# Patient Record
Sex: Female | Born: 1960 | Race: White | Hispanic: No | Marital: Married | Smoking: Former smoker
Health system: Southern US, Community
[De-identification: ages and names within clinical notes are randomized; demographics above are authoritative.]

## PROBLEM LIST (undated history)

## (undated) DIAGNOSIS — I82409 Acute embolism and thrombosis of unspecified deep veins of unspecified lower extremity: Secondary | ICD-10-CM

## (undated) HISTORY — DX: Acute embolism and thrombosis of unspecified deep veins of unspecified lower extremity: I82.409

## (undated) HISTORY — PX: SPINE SURGERY: SHX786

---

## 1998-05-23 ENCOUNTER — Inpatient Hospital Stay (HOSPITAL_COMMUNITY): Admission: RE | Admit: 1998-05-23 | Discharge: 1998-05-24 | Payer: Self-pay | Admitting: Specialist

## 1998-12-10 ENCOUNTER — Ambulatory Visit (HOSPITAL_COMMUNITY): Admission: RE | Admit: 1998-12-10 | Discharge: 1998-12-10 | Payer: Self-pay

## 2000-04-30 ENCOUNTER — Emergency Department (HOSPITAL_COMMUNITY): Admission: EM | Admit: 2000-04-30 | Discharge: 2000-04-30 | Payer: Self-pay | Admitting: Emergency Medicine

## 2002-01-24 ENCOUNTER — Encounter: Payer: Self-pay | Admitting: Family Medicine

## 2002-01-24 ENCOUNTER — Inpatient Hospital Stay (HOSPITAL_COMMUNITY): Admission: EM | Admit: 2002-01-24 | Discharge: 2002-01-25 | Payer: Self-pay | Admitting: Family Medicine

## 2002-01-24 ENCOUNTER — Ambulatory Visit: Admission: RE | Admit: 2002-01-24 | Discharge: 2002-01-24 | Payer: Self-pay | Admitting: Family Medicine

## 2002-01-25 ENCOUNTER — Encounter: Payer: Self-pay | Admitting: Oncology

## 2002-02-20 ENCOUNTER — Encounter: Payer: Self-pay | Admitting: Family Medicine

## 2002-02-20 ENCOUNTER — Ambulatory Visit (HOSPITAL_COMMUNITY): Admission: RE | Admit: 2002-02-20 | Discharge: 2002-02-20 | Payer: Self-pay | Admitting: Family Medicine

## 2004-10-25 ENCOUNTER — Ambulatory Visit: Payer: Self-pay | Admitting: Oncology

## 2004-12-21 ENCOUNTER — Ambulatory Visit: Payer: Self-pay | Admitting: Oncology

## 2005-01-05 ENCOUNTER — Emergency Department (HOSPITAL_COMMUNITY): Admission: EM | Admit: 2005-01-05 | Discharge: 2005-01-05 | Payer: Self-pay | Admitting: Emergency Medicine

## 2005-02-07 ENCOUNTER — Ambulatory Visit: Payer: Self-pay | Admitting: Oncology

## 2005-04-04 ENCOUNTER — Ambulatory Visit: Payer: Self-pay | Admitting: Oncology

## 2005-05-30 ENCOUNTER — Ambulatory Visit: Payer: Self-pay | Admitting: Oncology

## 2005-08-16 ENCOUNTER — Ambulatory Visit: Payer: Self-pay | Admitting: Oncology

## 2005-10-14 ENCOUNTER — Ambulatory Visit: Payer: Self-pay | Admitting: Oncology

## 2006-01-19 ENCOUNTER — Ambulatory Visit: Payer: Self-pay | Admitting: Oncology

## 2006-04-19 ENCOUNTER — Ambulatory Visit: Payer: Self-pay | Admitting: Oncology

## 2006-04-21 LAB — CBC WITH DIFFERENTIAL/PLATELET
BASO%: 0.5 % (ref 0.0–2.0)
Basophils Absolute: 0 10*3/uL (ref 0.0–0.1)
EOS%: 2.7 % (ref 0.0–7.0)
Eosinophils Absolute: 0.1 10*3/uL (ref 0.0–0.5)
HCT: 36.3 % (ref 34.8–46.6)
HGB: 12.1 g/dL (ref 11.6–15.9)
LYMPH%: 29.5 % (ref 14.0–48.0)
MCH: 28.8 pg (ref 26.0–34.0)
MCHC: 33.4 g/dL (ref 32.0–36.0)
MCV: 86.5 fL (ref 81.0–101.0)
MONO#: 0.4 10*3/uL (ref 0.1–0.9)
MONO%: 8.3 % (ref 0.0–13.0)
NEUT#: 3.1 10*3/uL (ref 1.5–6.5)
NEUT%: 59 % (ref 39.6–76.8)
Platelets: 325 10*3/uL (ref 145–400)
RBC: 4.2 10*6/uL (ref 3.70–5.32)
RDW: 16.2 % — ABNORMAL HIGH (ref 11.3–14.5)
WBC: 5.3 10*3/uL (ref 3.9–10.0)
lymph#: 1.6 10*3/uL (ref 0.9–3.3)

## 2006-04-21 LAB — PROTHROMBIN TIME
INR: 1 (ref 0.0–1.5)
Prothrombin Time: 13.7 seconds (ref 11.6–15.2)

## 2006-04-26 LAB — LUPUS ANTICOAGULANT PANEL
DRVVT 1:1 Mix: 39.5 secs (ref 26.75–42.95)
DRVVT: 50.9 secs — ABNORMAL HIGH (ref 26.75–42.95)
PTT Lupus Anticoagulant: 59.2 secs — ABNORMAL HIGH (ref 30.5–43.1)
PTTLA 4:1 Mix: 54 secs — ABNORMAL HIGH (ref 30.5–43.1)
PTTLA Confirmation: 14.9 secs — ABNORMAL HIGH (ref <8.0)

## 2006-04-26 LAB — HEXAGONAL PHOSPHOLIPID NEUTRALIZATION: Hex Phosph Neut Test: POSITIVE — AB

## 2006-08-17 ENCOUNTER — Ambulatory Visit: Payer: Self-pay | Admitting: Oncology

## 2006-08-18 LAB — CBC WITH DIFFERENTIAL/PLATELET
BASO%: 0.9 % (ref 0.0–2.0)
Basophils Absolute: 0 10*3/uL (ref 0.0–0.1)
EOS%: 2.3 % (ref 0.0–7.0)
Eosinophils Absolute: 0.1 10*3/uL (ref 0.0–0.5)
HCT: 34.8 % (ref 34.8–46.6)
HGB: 11.8 g/dL (ref 11.6–15.9)
LYMPH%: 30.6 % (ref 14.0–48.0)
MCH: 29.5 pg (ref 26.0–34.0)
MCHC: 33.9 g/dL (ref 32.0–36.0)
MCV: 87.3 fL (ref 81.0–101.0)
MONO#: 0.4 10*3/uL (ref 0.1–0.9)
MONO%: 7.6 % (ref 0.0–13.0)
NEUT#: 3 10*3/uL (ref 1.5–6.5)
NEUT%: 58.6 % (ref 39.6–76.8)
Platelets: 340 10*3/uL (ref 145–400)
RBC: 3.99 10*6/uL (ref 3.70–5.32)
RDW: 13.5 % (ref 11.3–14.5)
WBC: 5.2 10*3/uL (ref 3.9–10.0)
lymph#: 1.6 10*3/uL (ref 0.9–3.3)

## 2006-08-23 LAB — D-DIMER, QUANTITATIVE (NOT AT ARMC): D-Dimer, Quant: 0.49 ug/mL-FEU — ABNORMAL HIGH (ref 0.00–0.48)

## 2006-08-23 LAB — CARDIOLIPIN ANTIBODIES, IGG, IGM, IGA
Anticardiolipin IgA: 17 [APL'U] (ref <13)
Anticardiolipin IgG: <7 [GPL'U] (ref <11)
Anticardiolipin IgM: <7 [MPL'U] (ref <10)

## 2006-08-23 LAB — HEXAGONAL PHOSPHOLIPID NEUTRALIZATION: Hex Phosph Neut Test: POSITIVE — AB

## 2006-08-25 LAB — LUPUS ANTICOAGULANT PANEL: Lupus Anticoagulant: DETECTED

## 2006-09-12 LAB — PROTIME-INR
INR: 1.8 — ABNORMAL LOW (ref 2.00–3.50)
Protime: 21.6 Seconds — ABNORMAL HIGH (ref 10.6–13.4)

## 2006-10-05 ENCOUNTER — Ambulatory Visit: Payer: Self-pay | Admitting: Oncology

## 2006-11-06 LAB — CBC WITH DIFFERENTIAL/PLATELET
BASO%: 1.5 % (ref 0.0–2.0)
Basophils Absolute: 0.1 10e3/uL (ref 0.0–0.1)
EOS%: 3.4 % (ref 0.0–7.0)
Eosinophils Absolute: 0.2 10e3/uL (ref 0.0–0.5)
HCT: 32.5 % — ABNORMAL LOW (ref 34.8–46.6)
HGB: 10.8 g/dL — ABNORMAL LOW (ref 11.6–15.9)
LYMPH%: 33.6 % (ref 14.0–48.0)
MCH: 27.6 pg (ref 26.0–34.0)
MCHC: 33.2 g/dL (ref 32.0–36.0)
MCV: 83.1 fL (ref 81.0–101.0)
MONO#: 0.5 10e3/uL (ref 0.1–0.9)
MONO%: 7 % (ref 0.0–13.0)
NEUT#: 3.6 10e3/uL (ref 1.5–6.5)
NEUT%: 54.5 % (ref 39.6–76.8)
Platelets: 368 10e3/uL (ref 145–400)
RBC: 3.91 10e6/uL (ref 3.70–5.32)
RDW: 12.4 % (ref 11.3–14.5)
WBC: 6.7 10e3/uL (ref 3.9–10.0)
lymph#: 2.2 10e3/uL (ref 0.9–3.3)

## 2006-11-06 LAB — PROTIME-INR
INR: 2.5 (ref 2.00–3.50)
Protime: 30 Seconds — ABNORMAL HIGH (ref 10.6–13.4)

## 2006-11-29 ENCOUNTER — Ambulatory Visit: Payer: Self-pay | Admitting: Oncology

## 2006-12-15 LAB — CBC WITH DIFFERENTIAL/PLATELET
BASO%: 1.3 % (ref 0.0–2.0)
Basophils Absolute: 0.1 10*3/uL (ref 0.0–0.1)
EOS%: 2.8 % (ref 0.0–7.0)
Eosinophils Absolute: 0.1 10*3/uL (ref 0.0–0.5)
HCT: 31.8 % — ABNORMAL LOW (ref 34.8–46.6)
HGB: 10.5 g/dL — ABNORMAL LOW (ref 11.6–15.9)
LYMPH%: 34.3 % (ref 14.0–48.0)
MCH: 26.7 pg (ref 26.0–34.0)
MCHC: 32.9 g/dL (ref 32.0–36.0)
MCV: 81.1 fL (ref 81.0–101.0)
MONO#: 0.3 10*3/uL (ref 0.1–0.9)
MONO%: 6 % (ref 0.0–13.0)
NEUT#: 2.8 10*3/uL (ref 1.5–6.5)
NEUT%: 55.6 % (ref 39.6–76.8)
Platelets: 346 10*3/uL (ref 145–400)
RBC: 3.92 10*6/uL (ref 3.70–5.32)
RDW: 12.7 % (ref 11.3–14.5)
WBC: 4.9 10*3/uL (ref 3.9–10.0)
lymph#: 1.7 10*3/uL (ref 0.9–3.3)

## 2006-12-15 LAB — PROTIME-INR
INR: 1.9 — ABNORMAL LOW (ref 2.00–3.50)
Protime: 22.8 Seconds — ABNORMAL HIGH (ref 10.6–13.4)

## 2006-12-19 LAB — LUPUS ANTICOAGULANT PANEL
DRVVT 1:1 Mix: 42.1 secs (ref 31.9–44.2)
DRVVT: 74.7 secs — ABNORMAL HIGH (ref 31.9–44.2)
PTT Lupus Anticoagulant: 74.6 secs — ABNORMAL HIGH (ref 36.3–48.8)
PTTLA 4:1 Mix: 62.5 secs — ABNORMAL HIGH (ref 36.3–48.8)
PTTLA Confirmation: 4.2 secs (ref <8.0)

## 2006-12-19 LAB — D-DIMER, QUANTITATIVE: D-Dimer, Quant: 0.43 ug/mL-FEU (ref 0.00–0.48)

## 2006-12-25 LAB — CARDIOLIPIN ANTIBODIES, IGG, IGM, IGA
Anticardiolipin IgG: <7 [GPL'U] (ref <11)
Anticardiolipin IgM: <7 [MPL'U] (ref <10)

## 2007-01-10 ENCOUNTER — Ambulatory Visit: Payer: Self-pay | Admitting: Oncology

## 2007-01-17 LAB — CBC WITH DIFFERENTIAL/PLATELET
BASO%: 1.2 % (ref 0.0–2.0)
Basophils Absolute: 0.1 10*3/uL (ref 0.0–0.1)
EOS%: 3 % (ref 0.0–7.0)
Eosinophils Absolute: 0.2 10*3/uL (ref 0.0–0.5)
HCT: 32.6 % — ABNORMAL LOW (ref 34.8–46.6)
HGB: 10.4 g/dL — ABNORMAL LOW (ref 11.6–15.9)
LYMPH%: 34.3 % (ref 14.0–48.0)
MCH: 26.5 pg (ref 26.0–34.0)
MCHC: 32 g/dL (ref 32.0–36.0)
MCV: 82.9 fL (ref 81.0–101.0)
MONO#: 0.4 10*3/uL (ref 0.1–0.9)
MONO%: 7.9 % (ref 0.0–13.0)
NEUT#: 2.9 10*3/uL (ref 1.5–6.5)
NEUT%: 53.6 % (ref 39.6–76.8)
Platelets: 361 10*3/uL (ref 145–400)
RBC: 3.94 10*6/uL (ref 3.70–5.32)
RDW: 12.7 % (ref 11.3–14.5)
WBC: 5.5 10*3/uL (ref 3.9–10.0)
lymph#: 1.9 10*3/uL (ref 0.9–3.3)

## 2007-01-17 LAB — PROTIME-INR
INR: 2 (ref 2.00–3.50)
Protime: 24 Seconds — ABNORMAL HIGH (ref 10.6–13.4)

## 2007-02-16 ENCOUNTER — Ambulatory Visit: Payer: Self-pay | Admitting: Oncology

## 2007-02-16 LAB — CBC WITH DIFFERENTIAL/PLATELET
BASO%: 1.3 % (ref 0.0–2.0)
Basophils Absolute: 0.1 10*3/uL (ref 0.0–0.1)
EOS%: 2.5 % (ref 0.0–7.0)
Eosinophils Absolute: 0.1 10*3/uL (ref 0.0–0.5)
HCT: 33.2 % — ABNORMAL LOW (ref 34.8–46.6)
HGB: 10.6 g/dL — ABNORMAL LOW (ref 11.6–15.9)
LYMPH%: 33.6 % (ref 14.0–48.0)
MCH: 25.7 pg — ABNORMAL LOW (ref 26.0–34.0)
MCHC: 32 g/dL (ref 32.0–36.0)
MCV: 80.4 fL — ABNORMAL LOW (ref 81.0–101.0)
MONO#: 0.5 10*3/uL (ref 0.1–0.9)
MONO%: 8.5 % (ref 0.0–13.0)
NEUT#: 3 10*3/uL (ref 1.5–6.5)
NEUT%: 54.1 % (ref 39.6–76.8)
Platelets: 377 10*3/uL (ref 145–400)
RBC: 4.13 10*6/uL (ref 3.70–5.32)
RDW: 12.8 % (ref 11.3–14.5)
WBC: 5.5 10*3/uL (ref 3.9–10.0)
lymph#: 1.8 10*3/uL (ref 0.9–3.3)

## 2007-02-16 LAB — PROTIME-INR
INR: 2.9 (ref 2.00–3.50)
Protime: 34.8 Seconds — ABNORMAL HIGH (ref 10.6–13.4)

## 2007-03-23 LAB — CBC WITH DIFFERENTIAL/PLATELET
BASO%: 1.2 % (ref 0.0–2.0)
Basophils Absolute: 0.1 10*3/uL (ref 0.0–0.1)
EOS%: 3.8 % (ref 0.0–7.0)
Eosinophils Absolute: 0.2 10*3/uL (ref 0.0–0.5)
HCT: 33.5 % — ABNORMAL LOW (ref 34.8–46.6)
HGB: 11.3 g/dL — ABNORMAL LOW (ref 11.6–15.9)
LYMPH%: 37.5 % (ref 14.0–48.0)
MCH: 27.7 pg (ref 26.0–34.0)
MCHC: 33.7 g/dL (ref 32.0–36.0)
MCV: 82.3 fL (ref 81.0–101.0)
MONO#: 0.4 10*3/uL (ref 0.1–0.9)
MONO%: 9 % (ref 0.0–13.0)
NEUT#: 2.2 10*3/uL (ref 1.5–6.5)
NEUT%: 48.5 % (ref 39.6–76.8)
Platelets: 316 10*3/uL (ref 145–400)
RBC: 4.07 10*6/uL (ref 3.70–5.32)
RDW: 14.3 % (ref 11.3–14.5)
WBC: 4.6 10*3/uL (ref 3.9–10.0)
lymph#: 1.7 10*3/uL (ref 0.9–3.3)

## 2007-03-23 LAB — PROTIME-INR
INR: 2.2 (ref 2.00–3.50)
Protime: 26.4 Seconds — ABNORMAL HIGH (ref 10.6–13.4)

## 2007-03-26 ENCOUNTER — Emergency Department (HOSPITAL_COMMUNITY): Admission: EM | Admit: 2007-03-26 | Discharge: 2007-03-26 | Payer: Self-pay | Admitting: Emergency Medicine

## 2007-04-10 ENCOUNTER — Ambulatory Visit: Payer: Self-pay | Admitting: Oncology

## 2007-04-13 LAB — CBC WITH DIFFERENTIAL/PLATELET
BASO%: 1 % (ref 0.0–2.0)
Basophils Absolute: 0 10*3/uL (ref 0.0–0.1)
EOS%: 2.5 % (ref 0.0–7.0)
Eosinophils Absolute: 0.1 10*3/uL (ref 0.0–0.5)
HCT: 31.8 % — ABNORMAL LOW (ref 34.8–46.6)
HGB: 10.9 g/dL — ABNORMAL LOW (ref 11.6–15.9)
LYMPH%: 34.1 % (ref 14.0–48.0)
MCH: 27.4 pg (ref 26.0–34.0)
MCHC: 34.2 g/dL (ref 32.0–36.0)
MCV: 80.2 fL — ABNORMAL LOW (ref 81.0–101.0)
MONO#: 0.3 10*3/uL (ref 0.1–0.9)
MONO%: 8.5 % (ref 0.0–13.0)
NEUT#: 2.2 10*3/uL (ref 1.5–6.5)
NEUT%: 53.9 % (ref 39.6–76.8)
Platelets: 360 10*3/uL (ref 145–400)
RBC: 3.97 10*6/uL (ref 3.70–5.32)
RDW: 16.8 % — ABNORMAL HIGH (ref 11.3–14.5)
WBC: 4.1 10*3/uL (ref 3.9–10.0)
lymph#: 1.4 10*3/uL (ref 0.9–3.3)

## 2007-04-13 LAB — PROTIME-INR
INR: 2.1 (ref 2.00–3.50)
Protime: 25.2 Seconds — ABNORMAL HIGH (ref 10.6–13.4)

## 2007-04-16 LAB — D-DIMER, QUANTITATIVE: D-Dimer, Quant: 0.26 ug/mL-FEU (ref 0.00–0.48)

## 2007-04-16 LAB — LUPUS ANTICOAGULANT PANEL
DRVVT 1:1 Mix: 43.5 secs (ref 36.1–47.0)
DRVVT: 65.3 secs — ABNORMAL HIGH (ref 36.1–47.0)
PTT Lupus Anticoagulant: 73 secs — ABNORMAL HIGH (ref 36.3–48.8)
PTTLA 4:1 Mix: 64.4 secs — ABNORMAL HIGH (ref 36.3–48.8)
PTTLA Confirmation: 0 secs (ref <8.0)

## 2007-06-04 ENCOUNTER — Ambulatory Visit: Payer: Self-pay | Admitting: Oncology

## 2007-06-06 LAB — CBC WITH DIFFERENTIAL/PLATELET
BASO%: 0.9 % (ref 0.0–2.0)
Basophils Absolute: 0.1 10*3/uL (ref 0.0–0.1)
EOS%: 4.2 % (ref 0.0–7.0)
Eosinophils Absolute: 0.2 10*3/uL (ref 0.0–0.5)
HCT: 29 % — ABNORMAL LOW (ref 34.8–46.6)
HGB: 9.8 g/dL — ABNORMAL LOW (ref 11.6–15.9)
LYMPH%: 32.6 % (ref 14.0–48.0)
MCH: 27.6 pg (ref 26.0–34.0)
MCHC: 33.8 g/dL (ref 32.0–36.0)
MCV: 81.7 fL (ref 81.0–101.0)
MONO#: 0.5 10*3/uL (ref 0.1–0.9)
MONO%: 7.6 % (ref 0.0–13.0)
NEUT#: 3.3 10*3/uL (ref 1.5–6.5)
NEUT%: 54.8 % (ref 39.6–76.8)
Platelets: 323 10*3/uL (ref 145–400)
RBC: 3.55 10*6/uL — ABNORMAL LOW (ref 3.70–5.32)
RDW: 13.2 % (ref 11.3–14.5)
WBC: 6 10*3/uL (ref 3.9–10.0)
lymph#: 1.9 10*3/uL (ref 0.9–3.3)

## 2007-06-06 LAB — PROTIME-INR
INR: 1.5 — ABNORMAL LOW (ref 2.00–3.50)
Protime: 18 Seconds — ABNORMAL HIGH (ref 10.6–13.4)

## 2007-06-20 LAB — PROTIME-INR
INR: 2.4 (ref 2.00–3.50)
Protime: 28.8 Seconds — ABNORMAL HIGH (ref 10.6–13.4)

## 2007-07-06 LAB — PROTIME-INR
INR: 2 (ref 2.00–3.50)
Protime: 24 Seconds — ABNORMAL HIGH (ref 10.6–13.4)

## 2007-08-17 ENCOUNTER — Ambulatory Visit: Payer: Self-pay | Admitting: Oncology

## 2007-08-17 LAB — PROTIME-INR
INR: 2.1 (ref 2.00–3.50)
Protime: 25.2 Seconds — ABNORMAL HIGH (ref 10.6–13.4)

## 2007-09-04 LAB — CBC WITH DIFFERENTIAL/PLATELET
BASO%: 0.7 % (ref 0.0–2.0)
Basophils Absolute: 0 10*3/uL (ref 0.0–0.1)
EOS%: 2.7 % (ref 0.0–7.0)
Eosinophils Absolute: 0.2 10*3/uL (ref 0.0–0.5)
HCT: 28.3 % — ABNORMAL LOW (ref 34.8–46.6)
HGB: 9.6 g/dL — ABNORMAL LOW (ref 11.6–15.9)
LYMPH%: 31.6 % (ref 14.0–48.0)
MCH: 26.1 pg (ref 26.0–34.0)
MCHC: 34 g/dL (ref 32.0–36.0)
MCV: 76.8 fL — ABNORMAL LOW (ref 81.0–101.0)
MONO#: 0.4 10*3/uL (ref 0.1–0.9)
MONO%: 7.6 % (ref 0.0–13.0)
NEUT#: 3.3 10*3/uL (ref 1.5–6.5)
NEUT%: 57.4 % (ref 39.6–76.8)
Platelets: 383 10*3/uL (ref 145–400)
RBC: 3.68 10*6/uL — ABNORMAL LOW (ref 3.70–5.32)
RDW: 15.6 % — ABNORMAL HIGH (ref 11.3–14.5)
WBC: 5.8 10*3/uL (ref 3.9–10.0)
lymph#: 1.8 10*3/uL (ref 0.9–3.3)

## 2007-09-04 LAB — PROTIME-INR
INR: 2.3 (ref 2.00–3.50)
Protime: 27.6 Seconds — ABNORMAL HIGH (ref 10.6–13.4)

## 2007-09-06 LAB — CARDIOLIPIN ANTIBODIES, IGG, IGM, IGA
Anticardiolipin IgA: 8 [APL'U] (ref <13)
Anticardiolipin IgG: <7 [GPL'U] (ref <11)
Anticardiolipin IgM: <7 [MPL'U] (ref <10)

## 2007-09-06 LAB — LUPUS ANTICOAGULANT PANEL
DRVVT 1:1 Mix: 42.8 secs (ref 36.1–47.0)
DRVVT: 79.2 secs — ABNORMAL HIGH (ref 36.1–47.0)
PTT Lupus Anticoagulant: 76.5 secs — ABNORMAL HIGH (ref 36.3–48.8)
PTTLA 4:1 Mix: 65.1 secs — ABNORMAL HIGH (ref 36.3–48.8)
PTTLA Confirmation: 1.9 secs (ref <8.0)

## 2007-10-02 ENCOUNTER — Ambulatory Visit: Payer: Self-pay | Admitting: Oncology

## 2007-10-04 LAB — CBC WITH DIFFERENTIAL/PLATELET
BASO%: 0.9 % (ref 0.0–2.0)
Basophils Absolute: 0 10*3/uL (ref 0.0–0.1)
EOS%: 1.9 % (ref 0.0–7.0)
Eosinophils Absolute: 0.1 10*3/uL (ref 0.0–0.5)
HCT: 28.8 % — ABNORMAL LOW (ref 34.8–46.6)
HGB: 9.1 g/dL — ABNORMAL LOW (ref 11.6–15.9)
LYMPH%: 36.6 % (ref 14.0–48.0)
MCH: 24.8 pg — ABNORMAL LOW (ref 26.0–34.0)
MCHC: 31.5 g/dL — ABNORMAL LOW (ref 32.0–36.0)
MCV: 78.7 fL — ABNORMAL LOW (ref 81.0–101.0)
MONO#: 0.5 10*3/uL (ref 0.1–0.9)
MONO%: 8.2 % (ref 0.0–13.0)
NEUT#: 2.9 10*3/uL (ref 1.5–6.5)
NEUT%: 52.5 % (ref 39.6–76.8)
Platelets: 356 10*3/uL (ref 145–400)
RBC: 3.66 10*6/uL — ABNORMAL LOW (ref 3.70–5.32)
RDW: 13.8 % (ref 11.3–14.5)
WBC: 5.6 10*3/uL (ref 3.9–10.0)
lymph#: 2 10*3/uL (ref 0.9–3.3)

## 2007-10-04 LAB — PROTIME-INR
INR: 2.1 (ref 2.00–3.50)
Protime: 25.2 Seconds — ABNORMAL HIGH (ref 10.6–13.4)

## 2007-11-06 LAB — CBC WITH DIFFERENTIAL/PLATELET
BASO%: 1.1 % (ref 0.0–2.0)
Basophils Absolute: 0.1 10*3/uL (ref 0.0–0.1)
EOS%: 1.9 % (ref 0.0–7.0)
Eosinophils Absolute: 0.1 10*3/uL (ref 0.0–0.5)
HCT: 33.1 % — ABNORMAL LOW (ref 34.8–46.6)
HGB: 10.9 g/dL — ABNORMAL LOW (ref 11.6–15.9)
LYMPH%: 35.8 % (ref 14.0–48.0)
MCH: 26.4 pg (ref 26.0–34.0)
MCHC: 32.9 g/dL (ref 32.0–36.0)
MCV: 80.1 fL — ABNORMAL LOW (ref 81.0–101.0)
MONO#: 0.5 10*3/uL (ref 0.1–0.9)
MONO%: 8.2 % (ref 0.0–13.0)
NEUT#: 3.4 10*3/uL (ref 1.5–6.5)
NEUT%: 53 % (ref 39.6–76.8)
Platelets: 349 10*3/uL (ref 145–400)
RBC: 4.13 10*6/uL (ref 3.70–5.32)
RDW: 15.1 % — ABNORMAL HIGH (ref 11.3–14.5)
WBC: 6.3 10*3/uL (ref 3.9–10.0)
lymph#: 2.3 10*3/uL (ref 0.9–3.3)

## 2007-11-06 LAB — PROTIME-INR
INR: 2 (ref 2.00–3.50)
Protime: 24 Seconds — ABNORMAL HIGH (ref 10.6–13.4)

## 2007-11-30 ENCOUNTER — Ambulatory Visit: Payer: Self-pay | Admitting: Oncology

## 2007-12-10 LAB — CBC WITH DIFFERENTIAL/PLATELET
BASO%: 0.9 % (ref 0.0–2.0)
Basophils Absolute: 0 10*3/uL (ref 0.0–0.1)
EOS%: 5.6 % (ref 0.0–7.0)
Eosinophils Absolute: 0.2 10*3/uL (ref 0.0–0.5)
HCT: 31.9 % — ABNORMAL LOW (ref 34.8–46.6)
HGB: 10.5 g/dL — ABNORMAL LOW (ref 11.6–15.9)
LYMPH%: 38.3 % (ref 14.0–48.0)
MCH: 27 pg (ref 26.0–34.0)
MCHC: 32.8 g/dL (ref 32.0–36.0)
MCV: 82.4 fL (ref 81.0–101.0)
MONO#: 0.3 10*3/uL (ref 0.1–0.9)
MONO%: 5.7 % (ref 0.0–13.0)
NEUT#: 2.2 10*3/uL (ref 1.5–6.5)
NEUT%: 49.4 % (ref 39.6–76.8)
Platelets: 188 10*3/uL (ref 145–400)
RBC: 3.87 10*6/uL (ref 3.70–5.32)
RDW: 13.8 % (ref 11.3–14.5)
WBC: 4.4 10*3/uL (ref 3.9–10.0)
lymph#: 1.7 10*3/uL (ref 0.9–3.3)

## 2007-12-10 LAB — PROTIME-INR
INR: 2.5 (ref 2.00–3.50)
Protime: 30 Seconds — ABNORMAL HIGH (ref 10.6–13.4)

## 2008-01-24 ENCOUNTER — Ambulatory Visit: Payer: Self-pay | Admitting: Oncology

## 2008-01-28 LAB — CBC WITH DIFFERENTIAL/PLATELET
BASO%: 0.3 % (ref 0.0–2.0)
Basophils Absolute: 0 10*3/uL (ref 0.0–0.1)
EOS%: 2.4 % (ref 0.0–7.0)
Eosinophils Absolute: 0.2 10*3/uL (ref 0.0–0.5)
HCT: 28.2 % — ABNORMAL LOW (ref 34.8–46.6)
HGB: 9.7 g/dL — ABNORMAL LOW (ref 11.6–15.9)
LYMPH%: 34.8 % (ref 14.0–48.0)
MCH: 27.2 pg (ref 26.0–34.0)
MCHC: 34.4 g/dL (ref 32.0–36.0)
MCV: 79.2 fL — ABNORMAL LOW (ref 81.0–101.0)
MONO#: 0.4 10*3/uL (ref 0.1–0.9)
MONO%: 5.8 % (ref 0.0–13.0)
NEUT#: 4.1 10*3/uL (ref 1.5–6.5)
NEUT%: 56.7 % (ref 39.6–76.8)
Platelets: 333 10*3/uL (ref 145–400)
RBC: 3.57 10*6/uL — ABNORMAL LOW (ref 3.70–5.32)
RDW: 16.4 % — ABNORMAL HIGH (ref 11.3–14.5)
WBC: 7.2 10*3/uL (ref 3.9–10.0)
lymph#: 2.5 10*3/uL (ref 0.9–3.3)

## 2008-01-28 LAB — PROTIME-INR
INR: 1.7 — ABNORMAL LOW (ref 2.00–3.50)
Protime: 20.4 Seconds — ABNORMAL HIGH (ref 10.6–13.4)

## 2008-02-29 LAB — CBC WITH DIFFERENTIAL/PLATELET
BASO%: 0.2 % (ref 0.0–2.0)
Basophils Absolute: 0 10*3/uL (ref 0.0–0.1)
EOS%: 1.8 % (ref 0.0–7.0)
Eosinophils Absolute: 0.1 10*3/uL (ref 0.0–0.5)
HCT: 29.9 % — ABNORMAL LOW (ref 34.8–46.6)
HGB: 10.2 g/dL — ABNORMAL LOW (ref 11.6–15.9)
LYMPH%: 31.4 % (ref 14.0–48.0)
MCH: 26.9 pg (ref 26.0–34.0)
MCHC: 34 g/dL (ref 32.0–36.0)
MCV: 79.3 fL — ABNORMAL LOW (ref 81.0–101.0)
MONO#: 0.3 10*3/uL (ref 0.1–0.9)
MONO%: 7 % (ref 0.0–13.0)
NEUT#: 2.5 10*3/uL (ref 1.5–6.5)
NEUT%: 59.6 % (ref 39.6–76.8)
Platelets: 360 10*3/uL (ref 145–400)
RBC: 3.77 10*6/uL (ref 3.70–5.32)
RDW: 17.7 % — ABNORMAL HIGH (ref 11.3–14.5)
WBC: 4.2 10*3/uL (ref 3.9–10.0)
lymph#: 1.3 10*3/uL (ref 0.9–3.3)

## 2008-02-29 LAB — PROTIME-INR
INR: 1.5 — ABNORMAL LOW (ref 2.00–3.50)
Protime: 18 Seconds — ABNORMAL HIGH (ref 10.6–13.4)

## 2008-03-06 LAB — BETA-2 GLYCOPROTEIN ANTIBODIES
Beta-2 Glyco I IgG: 7 U/mL (ref <20)
Beta-2-Glycoprotein I IgA: <4 U/mL (ref <10)
Beta-2-Glycoprotein I IgM: 5 U/mL (ref <10)

## 2008-03-06 LAB — CARDIOLIPIN ANTIBODIES, IGG, IGM, IGA
Anticardiolipin IgA: 14 [APL'U] (ref <13)
Anticardiolipin IgG: <7 [GPL'U] (ref <11)
Anticardiolipin IgM: <7 [MPL'U] (ref <10)

## 2008-03-06 LAB — LUPUS ANTICOAGULANT PANEL
DRVVT 1:1 Mix: 40.5 secs (ref 36.1–47.0)
DRVVT: 58.1 secs — ABNORMAL HIGH (ref 36.1–47.0)
PTT Lupus Anticoagulant: 65.1 secs — ABNORMAL HIGH (ref 36.3–48.8)
PTTLA 4:1 Mix: 55.7 secs — ABNORMAL HIGH (ref 36.3–48.8)
PTTLA Confirmation: 4.6 secs (ref <8.0)

## 2008-03-14 ENCOUNTER — Ambulatory Visit: Payer: Self-pay | Admitting: Oncology

## 2008-03-18 LAB — PROTIME-INR
INR: 2.1 (ref 2.00–3.50)
Protime: 25.2 Seconds — ABNORMAL HIGH (ref 10.6–13.4)

## 2008-05-23 ENCOUNTER — Ambulatory Visit: Payer: Self-pay | Admitting: Oncology

## 2008-05-27 LAB — PROTHROMBIN TIME
INR: 2.4 — ABNORMAL HIGH (ref 0.0–1.5)
Prothrombin Time: 27 seconds — ABNORMAL HIGH (ref 11.6–15.2)

## 2008-05-27 LAB — CBC WITH DIFFERENTIAL/PLATELET
BASO%: 0.6 % (ref 0.0–2.0)
Basophils Absolute: 0 10*3/uL (ref 0.0–0.1)
EOS%: 1.6 % (ref 0.0–7.0)
Eosinophils Absolute: 0.1 10*3/uL (ref 0.0–0.5)
HCT: 30.6 % — ABNORMAL LOW (ref 34.8–46.6)
HGB: 10.3 g/dL — ABNORMAL LOW (ref 11.6–15.9)
LYMPH%: 31.1 % (ref 14.0–48.0)
MCH: 27.6 pg (ref 26.0–34.0)
MCHC: 33.6 g/dL (ref 32.0–36.0)
MCV: 82.2 fL (ref 81.0–101.0)
MONO#: 0.3 10*3/uL (ref 0.1–0.9)
MONO%: 6.3 % (ref 0.0–13.0)
NEUT#: 2.8 10*3/uL (ref 1.5–6.5)
NEUT%: 60.4 % (ref 39.6–76.8)
Platelets: 295 10*3/uL (ref 145–400)
RBC: 3.72 10*6/uL (ref 3.70–5.32)
RDW: 21.5 % — ABNORMAL HIGH (ref 11.3–14.5)
WBC: 4.6 10*3/uL (ref 3.9–10.0)
lymph#: 1.4 10*3/uL (ref 0.9–3.3)

## 2008-05-29 LAB — CARDIOLIPIN ANTIBODIES, IGG, IGM, IGA
Anticardiolipin IgA: 10 [APL'U] (ref <13)
Anticardiolipin IgG: <7 [GPL'U] (ref <11)
Anticardiolipin IgM: <7 [MPL'U] (ref <10)

## 2008-05-29 LAB — LUPUS ANTICOAGULANT PANEL
DRVVT 1:1 Mix: 41 secs (ref 36.1–47.0)
DRVVT: 65.4 secs — ABNORMAL HIGH (ref 36.1–47.0)
Lupus Anticoagulant: DETECTED
PTT Lupus Anticoagulant: 61.2 secs — ABNORMAL HIGH (ref 36.3–48.8)
PTTLA 4:1 Mix: 53.1 secs — ABNORMAL HIGH (ref 36.3–48.8)
PTTLA Confirmation: 6.3 secs (ref <8.0)

## 2008-06-02 LAB — HEXAGONAL PHOSPHOLIPID NEUTRALIZATION: Hex Phosph Neut Test: POSITIVE — AB

## 2008-06-26 ENCOUNTER — Ambulatory Visit: Payer: Self-pay | Admitting: Oncology

## 2008-06-26 LAB — PROTIME-INR
INR: 2 (ref 2.00–3.50)
Protime: 24 Seconds — ABNORMAL HIGH (ref 10.6–13.4)

## 2008-07-24 ENCOUNTER — Ambulatory Visit (HOSPITAL_COMMUNITY): Admission: RE | Admit: 2008-07-24 | Discharge: 2008-07-24 | Payer: Self-pay | Admitting: Oncology

## 2008-07-24 ENCOUNTER — Ambulatory Visit: Payer: Self-pay | Admitting: Vascular Surgery

## 2008-07-24 ENCOUNTER — Encounter: Payer: Self-pay | Admitting: Oncology

## 2008-07-24 LAB — CBC WITH DIFFERENTIAL/PLATELET
BASO%: 0 % (ref 0.0–2.0)
Basophils Absolute: 0 10*3/uL (ref 0.0–0.1)
EOS%: 2.7 % (ref 0.0–7.0)
Eosinophils Absolute: 0.1 10*3/uL (ref 0.0–0.5)
HCT: 28.6 % — ABNORMAL LOW (ref 34.8–46.6)
HGB: 9.5 g/dL — ABNORMAL LOW (ref 11.6–15.9)
LYMPH%: 29 % (ref 14.0–48.0)
MCH: 26.8 pg (ref 26.0–34.0)
MCHC: 33.2 g/dL (ref 32.0–36.0)
MCV: 80.5 fL — ABNORMAL LOW (ref 81.0–101.0)
MONO#: 0.5 10*3/uL (ref 0.1–0.9)
MONO%: 9.9 % (ref 0.0–13.0)
NEUT#: 2.9 10*3/uL (ref 1.5–6.5)
NEUT%: 58.4 % (ref 39.6–76.8)
Platelets: 293 10*3/uL (ref 145–400)
RBC: 3.55 10*6/uL — ABNORMAL LOW (ref 3.70–5.32)
RDW: 16.5 % — ABNORMAL HIGH (ref 11.3–14.5)
WBC: 4.9 10*3/uL (ref 3.9–10.0)
lymph#: 1.4 10*3/uL (ref 0.9–3.3)

## 2008-07-24 LAB — PROTIME-INR
INR: 2.2 (ref 2.00–3.50)
Protime: 26.4 Seconds — ABNORMAL HIGH (ref 10.6–13.4)

## 2008-08-15 ENCOUNTER — Ambulatory Visit: Payer: Self-pay | Admitting: Oncology

## 2008-10-31 ENCOUNTER — Ambulatory Visit: Payer: Self-pay | Admitting: Oncology

## 2008-10-31 LAB — PROTIME-INR
INR: 1.9 — ABNORMAL LOW (ref 2.00–3.50)
Protime: 22.8 Seconds — ABNORMAL HIGH (ref 10.6–13.4)

## 2008-12-01 LAB — PROTIME-INR
INR: 2.4 (ref 2.00–3.50)
Protime: 28.8 Seconds — ABNORMAL HIGH (ref 10.6–13.4)

## 2008-12-25 ENCOUNTER — Ambulatory Visit: Payer: Self-pay | Admitting: Oncology

## 2009-01-27 LAB — CBC WITH DIFFERENTIAL/PLATELET
BASO%: 0.2 % (ref 0.0–2.0)
Basophils Absolute: 0 10*3/uL (ref 0.0–0.1)
EOS%: 4.2 % (ref 0.0–7.0)
Eosinophils Absolute: 0.2 10*3/uL (ref 0.0–0.5)
HCT: 30.9 % — ABNORMAL LOW (ref 34.8–46.6)
HGB: 9.9 g/dL — ABNORMAL LOW (ref 11.6–15.9)
LYMPH%: 37.4 % (ref 14.0–48.0)
MCH: 24.4 pg — ABNORMAL LOW (ref 26.0–34.0)
MCHC: 32.1 g/dL (ref 32.0–36.0)
MCV: 75.8 fL — ABNORMAL LOW (ref 81.0–101.0)
MONO#: 0.4 10*3/uL (ref 0.1–0.9)
MONO%: 8.9 % (ref 0.0–13.0)
NEUT#: 2.2 10*3/uL (ref 1.5–6.5)
NEUT%: 49.3 % (ref 39.6–76.8)
Platelets: 425 10*3/uL — ABNORMAL HIGH (ref 145–400)
RBC: 4.07 10*6/uL (ref 3.70–5.32)
RDW: 27.6 % — ABNORMAL HIGH (ref 11.3–14.5)
WBC: 4.5 10*3/uL (ref 3.9–10.0)
lymph#: 1.7 10*3/uL (ref 0.9–3.3)

## 2009-01-27 LAB — FERRITIN: Ferritin: 14 ng/mL (ref 10–291)

## 2009-01-27 LAB — PROTIME-INR
INR: 2.1 (ref 2.00–3.50)
Protime: 25.2 Seconds — ABNORMAL HIGH (ref 10.6–13.4)

## 2009-02-20 ENCOUNTER — Ambulatory Visit: Payer: Self-pay | Admitting: Oncology

## 2009-02-26 LAB — PROTIME-INR
INR: 2.8 (ref 2.00–3.50)
Protime: 33.6 Seconds — ABNORMAL HIGH (ref 10.6–13.4)

## 2009-04-23 ENCOUNTER — Ambulatory Visit: Payer: Self-pay | Admitting: Oncology

## 2009-04-27 LAB — CBC WITH DIFFERENTIAL/PLATELET
BASO%: 0.6 % (ref 0.0–2.0)
Basophils Absolute: 0 10*3/uL (ref 0.0–0.1)
EOS%: 1.9 % (ref 0.0–7.0)
Eosinophils Absolute: 0.1 10*3/uL (ref 0.0–0.5)
HCT: 29.8 % — ABNORMAL LOW (ref 34.8–46.6)
HGB: 9.5 g/dL — ABNORMAL LOW (ref 11.6–15.9)
LYMPH%: 22.7 % (ref 14.0–49.7)
MCH: 25 pg — ABNORMAL LOW (ref 25.1–34.0)
MCHC: 31.9 g/dL (ref 31.5–36.0)
MCV: 78.4 fL — ABNORMAL LOW (ref 79.5–101.0)
MONO#: 0.5 10*3/uL (ref 0.1–0.9)
MONO%: 7.1 % (ref 0.0–14.0)
NEUT#: 4.9 10*3/uL (ref 1.5–6.5)
NEUT%: 67.7 % (ref 38.4–76.8)
Platelets: 360 10*3/uL (ref 145–400)
RBC: 3.8 10*6/uL (ref 3.70–5.45)
RDW: 14.9 % — ABNORMAL HIGH (ref 11.2–14.5)
WBC: 7.2 10*3/uL (ref 3.9–10.3)
lymph#: 1.6 10*3/uL (ref 0.9–3.3)

## 2009-04-27 LAB — PROTIME-INR
INR: 1.7 — ABNORMAL LOW (ref 2.00–3.50)
Protime: 20.4 Seconds — ABNORMAL HIGH (ref 10.6–13.4)

## 2009-05-29 LAB — PROTIME-INR
INR: 2.5 (ref 2.00–3.50)
Protime: 30 Seconds — ABNORMAL HIGH (ref 10.6–13.4)

## 2009-05-29 LAB — CBC WITH DIFFERENTIAL/PLATELET
BASO%: 0.8 % (ref 0.0–2.0)
Basophils Absolute: 0 10*3/uL (ref 0.0–0.1)
EOS%: 3.8 % (ref 0.0–7.0)
Eosinophils Absolute: 0.2 10*3/uL (ref 0.0–0.5)
HCT: 32.8 % — ABNORMAL LOW (ref 34.8–46.6)
HGB: 10.4 g/dL — ABNORMAL LOW (ref 11.6–15.9)
LYMPH%: 31.6 % (ref 14.0–49.7)
MCH: 26.4 pg (ref 25.1–34.0)
MCHC: 31.7 g/dL (ref 31.5–36.0)
MCV: 83.2 fL (ref 79.5–101.0)
MONO#: 0.5 10*3/uL (ref 0.1–0.9)
MONO%: 9 % (ref 0.0–14.0)
NEUT#: 2.9 10*3/uL (ref 1.5–6.5)
NEUT%: 54.8 % (ref 38.4–76.8)
Platelets: 288 10*3/uL (ref 145–400)
RBC: 3.94 10*6/uL (ref 3.70–5.45)
RDW: 18.4 % — ABNORMAL HIGH (ref 11.2–14.5)
WBC: 5.3 10*3/uL (ref 3.9–10.3)
lymph#: 1.7 10*3/uL (ref 0.9–3.3)

## 2009-06-18 ENCOUNTER — Ambulatory Visit: Payer: Self-pay | Admitting: Oncology

## 2009-07-21 ENCOUNTER — Ambulatory Visit: Payer: Self-pay | Admitting: Oncology

## 2009-07-21 LAB — PROTIME-INR
INR: 3.4 (ref 2.00–3.50)
Protime: 40.8 Seconds — ABNORMAL HIGH (ref 10.6–13.4)

## 2009-08-02 ENCOUNTER — Emergency Department (HOSPITAL_COMMUNITY): Admission: EM | Admit: 2009-08-02 | Discharge: 2009-08-02 | Payer: Self-pay | Admitting: Emergency Medicine

## 2009-08-02 ENCOUNTER — Encounter (INDEPENDENT_AMBULATORY_CARE_PROVIDER_SITE_OTHER): Payer: Self-pay | Admitting: Emergency Medicine

## 2009-08-02 ENCOUNTER — Ambulatory Visit: Payer: Self-pay | Admitting: Vascular Surgery

## 2009-09-01 ENCOUNTER — Ambulatory Visit: Payer: Self-pay | Admitting: Oncology

## 2009-09-04 LAB — PROTIME-INR
INR: 3.3 (ref 2.00–3.50)
Protime: 39.6 Seconds — ABNORMAL HIGH (ref 10.6–13.4)

## 2009-09-10 LAB — PROTIME-INR
INR: 1.8 — ABNORMAL LOW (ref 2.00–3.50)
Protime: 21.6 Seconds — ABNORMAL HIGH (ref 10.6–13.4)

## 2009-10-01 ENCOUNTER — Ambulatory Visit: Payer: Self-pay | Admitting: Oncology

## 2009-10-05 LAB — PROTIME-INR
INR: 2.1 (ref 2.00–3.50)
Protime: 25.2 Seconds — ABNORMAL HIGH (ref 10.6–13.4)

## 2009-10-27 LAB — CBC WITH DIFFERENTIAL/PLATELET
BASO%: 0.7 % (ref 0.0–2.0)
Basophils Absolute: 0 10*3/uL (ref 0.0–0.1)
EOS%: 2.9 % (ref 0.0–7.0)
Eosinophils Absolute: 0.1 10*3/uL (ref 0.0–0.5)
HCT: 31.6 % — ABNORMAL LOW (ref 34.8–46.6)
HGB: 10.4 g/dL — ABNORMAL LOW (ref 11.6–15.9)
LYMPH%: 30 % (ref 14.0–49.7)
MCH: 29.4 pg (ref 25.1–34.0)
MCHC: 33 g/dL (ref 31.5–36.0)
MCV: 89.1 fL (ref 79.5–101.0)
MONO#: 0.3 10*3/uL (ref 0.1–0.9)
MONO%: 6.1 % (ref 0.0–14.0)
NEUT#: 2.9 10*3/uL (ref 1.5–6.5)
NEUT%: 60.3 % (ref 38.4–76.8)
Platelets: 380 10*3/uL (ref 145–400)
RBC: 3.55 10*6/uL — ABNORMAL LOW (ref 3.70–5.45)
RDW: 15.1 % — ABNORMAL HIGH (ref 11.2–14.5)
WBC: 4.9 10*3/uL (ref 3.9–10.3)
lymph#: 1.5 10*3/uL (ref 0.9–3.3)

## 2009-10-27 LAB — PROTIME-INR
INR: 2.7 (ref 2.00–3.50)
Protime: 32.4 Seconds — ABNORMAL HIGH (ref 10.6–13.4)

## 2009-10-28 LAB — CARDIOLIPIN ANTIBODIES, IGG, IGM, IGA
Anticardiolipin IgA: <3 APL U/mL (ref <10)
Anticardiolipin IgG: 4 GPL U/mL (ref <10)
Anticardiolipin IgM: <3 MPL U/mL (ref <10)

## 2009-10-28 LAB — LUPUS ANTICOAGULANT PANEL
DRVVT 1:1 Mix: 40.5 secs (ref 36.1–47.0)
DRVVT: 77.4 secs — ABNORMAL HIGH (ref 34.7–40.5)
Lupus Anticoagulant: DETECTED
PTT Lupus Anticoagulant: 83 secs — ABNORMAL HIGH (ref 32.0–43.4)
PTTLA 4:1 Mix: 66.1 secs — ABNORMAL HIGH (ref 36.3–48.8)
PTTLA Confirmation: 9.9 secs — ABNORMAL HIGH (ref <8.0)

## 2009-10-28 LAB — FERRITIN: Ferritin: 15 ng/mL (ref 10–291)

## 2009-10-30 LAB — HEXAGONAL PHOSPHOLIPID NEUTRALIZATION: Hex Phosph Neut Test: POSITIVE — AB

## 2009-11-20 ENCOUNTER — Ambulatory Visit: Payer: Self-pay | Admitting: Oncology

## 2009-12-18 ENCOUNTER — Ambulatory Visit: Payer: Self-pay | Admitting: Oncology

## 2009-12-21 LAB — PROTIME-INR
INR: 1.9 — ABNORMAL LOW (ref 2.00–3.50)
Protime: 22.8 Seconds — ABNORMAL HIGH (ref 10.6–13.4)

## 2010-01-22 ENCOUNTER — Ambulatory Visit: Payer: Self-pay | Admitting: Oncology

## 2010-01-28 LAB — CBC WITH DIFFERENTIAL/PLATELET
BASO%: 1 % (ref 0.0–2.0)
Basophils Absolute: 0.1 10*3/uL (ref 0.0–0.1)
EOS%: 6.1 % (ref 0.0–7.0)
Eosinophils Absolute: 0.4 10*3/uL (ref 0.0–0.5)
HCT: 30 % — ABNORMAL LOW (ref 34.8–46.6)
HGB: 9.3 g/dL — ABNORMAL LOW (ref 11.6–15.9)
LYMPH%: 29.1 % (ref 14.0–49.7)
MCH: 25.6 pg (ref 25.1–34.0)
MCHC: 31 g/dL — ABNORMAL LOW (ref 31.5–36.0)
MCV: 82.6 fL (ref 79.5–101.0)
MONO#: 0.5 10*3/uL (ref 0.1–0.9)
MONO%: 8 % (ref 0.0–14.0)
NEUT#: 3.3 10*3/uL (ref 1.5–6.5)
NEUT%: 55.8 % (ref 38.4–76.8)
Platelets: 373 10*3/uL (ref 145–400)
RBC: 3.63 10*6/uL — ABNORMAL LOW (ref 3.70–5.45)
RDW: 14.1 % (ref 11.2–14.5)
WBC: 5.9 10*3/uL (ref 3.9–10.3)
lymph#: 1.7 10*3/uL (ref 0.9–3.3)
nRBC: 0 % (ref 0–0)

## 2010-01-28 LAB — PROTIME-INR
INR: 2.1 (ref 2.00–3.50)
Protime: 25.2 Seconds — ABNORMAL HIGH (ref 10.6–13.4)

## 2010-03-19 ENCOUNTER — Ambulatory Visit: Payer: Self-pay | Admitting: Oncology

## 2010-03-26 LAB — PROTIME-INR
INR: 1.7 — ABNORMAL LOW (ref 2.00–3.50)
Protime: 20.4 Seconds — ABNORMAL HIGH (ref 10.6–13.4)

## 2010-05-05 ENCOUNTER — Ambulatory Visit: Payer: Self-pay | Admitting: Oncology

## 2010-05-28 LAB — CBC WITH DIFFERENTIAL/PLATELET
BASO%: 0.8 % (ref 0.0–2.0)
Basophils Absolute: 0 10*3/uL (ref 0.0–0.1)
EOS%: 2.2 % (ref 0.0–7.0)
Eosinophils Absolute: 0.1 10*3/uL (ref 0.0–0.5)
HCT: 30.5 % — ABNORMAL LOW (ref 34.8–46.6)
HGB: 9.9 g/dL — ABNORMAL LOW (ref 11.6–15.9)
LYMPH%: 32.4 % (ref 14.0–49.7)
MCH: 28.5 pg (ref 25.1–34.0)
MCHC: 32.5 g/dL (ref 31.5–36.0)
MCV: 87.9 fL (ref 79.5–101.0)
MONO#: 0.4 10*3/uL (ref 0.1–0.9)
MONO%: 8 % (ref 0.0–14.0)
NEUT#: 2.8 10*3/uL (ref 1.5–6.5)
NEUT%: 56.6 % (ref 38.4–76.8)
Platelets: 287 10*3/uL (ref 145–400)
RBC: 3.47 10*6/uL — ABNORMAL LOW (ref 3.70–5.45)
RDW: 13.3 % (ref 11.2–14.5)
WBC: 4.9 10*3/uL (ref 3.9–10.3)
lymph#: 1.6 10*3/uL (ref 0.9–3.3)

## 2010-05-28 LAB — PROTIME-INR
INR: 3.3 (ref 2.00–3.50)
Protime: 39.6 Seconds — ABNORMAL HIGH (ref 10.6–13.4)

## 2010-06-07 ENCOUNTER — Emergency Department (HOSPITAL_COMMUNITY): Admission: EM | Admit: 2010-06-07 | Discharge: 2010-06-08 | Payer: Self-pay | Admitting: Emergency Medicine

## 2010-06-10 ENCOUNTER — Ambulatory Visit: Payer: Self-pay | Admitting: Vascular Surgery

## 2010-06-10 ENCOUNTER — Encounter (INDEPENDENT_AMBULATORY_CARE_PROVIDER_SITE_OTHER): Payer: Self-pay | Admitting: Orthopaedic Surgery

## 2010-06-10 ENCOUNTER — Ambulatory Visit: Admission: RE | Admit: 2010-06-10 | Discharge: 2010-06-10 | Payer: Self-pay | Admitting: Orthopaedic Surgery

## 2010-06-23 ENCOUNTER — Ambulatory Visit: Payer: Self-pay | Admitting: Oncology

## 2010-06-23 LAB — PROTIME-INR
INR: 1.8 — ABNORMAL LOW (ref 2.00–3.50)
Protime: 21.6 Seconds — ABNORMAL HIGH (ref 10.6–13.4)

## 2010-07-23 ENCOUNTER — Ambulatory Visit: Payer: Self-pay | Admitting: Oncology

## 2010-07-23 LAB — CBC WITH DIFFERENTIAL/PLATELET
BASO%: 0.8 % (ref 0.0–2.0)
Basophils Absolute: 0 10*3/uL (ref 0.0–0.1)
EOS%: 2.4 % (ref 0.0–7.0)
Eosinophils Absolute: 0.1 10*3/uL (ref 0.0–0.5)
HCT: 31.3 % — ABNORMAL LOW (ref 34.8–46.6)
HGB: 10.6 g/dL — ABNORMAL LOW (ref 11.6–15.9)
LYMPH%: 28 % (ref 14.0–49.7)
MCH: 30.1 pg (ref 25.1–34.0)
MCHC: 34 g/dL (ref 31.5–36.0)
MCV: 88.7 fL (ref 79.5–101.0)
MONO#: 0.3 10*3/uL (ref 0.1–0.9)
MONO%: 7.5 % (ref 0.0–14.0)
NEUT#: 2.8 10*3/uL (ref 1.5–6.5)
NEUT%: 61.3 % (ref 38.4–76.8)
Platelets: 350 10*3/uL (ref 145–400)
RBC: 3.53 10*6/uL — ABNORMAL LOW (ref 3.70–5.45)
RDW: 14.3 % (ref 11.2–14.5)
WBC: 4.6 10*3/uL (ref 3.9–10.3)
lymph#: 1.3 10*3/uL (ref 0.9–3.3)

## 2010-07-23 LAB — PROTHROMBIN TIME
INR: 1.57 — ABNORMAL HIGH (ref <1.50)
Prothrombin Time: 19 seconds — ABNORMAL HIGH (ref 11.6–15.2)

## 2010-07-23 LAB — IRON AND TIBC
%SAT: 7 % — ABNORMAL LOW (ref 20–55)
Iron: 29 ug/dL — ABNORMAL LOW (ref 42–145)
TIBC: 397 ug/dL (ref 250–470)
UIBC: 368 ug/dL

## 2010-07-23 LAB — FERRITIN: Ferritin: 9 ng/mL — ABNORMAL LOW (ref 10–291)

## 2010-07-23 LAB — CHCC SMEAR

## 2010-08-18 LAB — PROTIME-INR
INR: 1.8 — ABNORMAL LOW (ref 2.00–3.50)
Protime: 21.6 Seconds — ABNORMAL HIGH (ref 10.6–13.4)

## 2010-09-14 ENCOUNTER — Ambulatory Visit: Payer: Self-pay | Admitting: Oncology

## 2010-09-17 LAB — IRON AND TIBC
%SAT: 14 % — ABNORMAL LOW (ref 20–55)
Iron: 60 ug/dL (ref 42–145)
TIBC: 420 ug/dL (ref 250–470)
UIBC: 360 ug/dL

## 2010-09-17 LAB — CBC WITH DIFFERENTIAL/PLATELET
BASO%: 0.5 % (ref 0.0–2.0)
Basophils Absolute: 0 10*3/uL (ref 0.0–0.1)
EOS%: 2.1 % (ref 0.0–7.0)
Eosinophils Absolute: 0.1 10*3/uL (ref 0.0–0.5)
HCT: 35.4 % (ref 34.8–46.6)
HGB: 11.9 g/dL (ref 11.6–15.9)
LYMPH%: 27.4 % (ref 14.0–49.7)
MCH: 29.7 pg (ref 25.1–34.0)
MCHC: 33.5 g/dL (ref 31.5–36.0)
MCV: 88.8 fL (ref 79.5–101.0)
MONO#: 0.5 10*3/uL (ref 0.1–0.9)
MONO%: 6.9 % (ref 0.0–14.0)
NEUT#: 4.4 10*3/uL (ref 1.5–6.5)
NEUT%: 63.1 % (ref 38.4–76.8)
Platelets: 306 10*3/uL (ref 145–400)
RBC: 3.99 10*6/uL (ref 3.70–5.45)
RDW: 14.5 % (ref 11.2–14.5)
WBC: 6.9 10*3/uL (ref 3.9–10.3)
lymph#: 1.9 10*3/uL (ref 0.9–3.3)

## 2010-09-17 LAB — PROTIME-INR
INR: 1.7 — ABNORMAL LOW (ref 2.00–3.50)
Protime: 20.4 Seconds — ABNORMAL HIGH (ref 10.6–13.4)

## 2010-09-17 LAB — FERRITIN: Ferritin: 13 ng/mL (ref 10–291)

## 2010-10-22 ENCOUNTER — Ambulatory Visit: Payer: Self-pay | Admitting: Oncology

## 2010-10-26 LAB — CBC WITH DIFFERENTIAL/PLATELET
BASO%: 0.5 % (ref 0.0–2.0)
Basophils Absolute: 0 10*3/uL (ref 0.0–0.1)
EOS%: 2.1 % (ref 0.0–7.0)
Eosinophils Absolute: 0.1 10*3/uL (ref 0.0–0.5)
HCT: 37.7 % (ref 34.8–46.6)
HGB: 12.4 g/dL (ref 11.6–15.9)
LYMPH%: 25.9 % (ref 14.0–49.7)
MCH: 29.4 pg (ref 25.1–34.0)
MCHC: 32.9 g/dL (ref 31.5–36.0)
MCV: 89.3 fL (ref 79.5–101.0)
MONO#: 0.4 10*3/uL (ref 0.1–0.9)
MONO%: 7.5 % (ref 0.0–14.0)
NEUT#: 3.6 10*3/uL (ref 1.5–6.5)
NEUT%: 64 % (ref 38.4–76.8)
Platelets: 280 10*3/uL (ref 145–400)
RBC: 4.22 10*6/uL (ref 3.70–5.45)
RDW: 13.8 % (ref 11.2–14.5)
WBC: 5.6 10*3/uL (ref 3.9–10.3)
lymph#: 1.5 10*3/uL (ref 0.9–3.3)

## 2010-10-26 LAB — PROTIME-INR
INR: 2 (ref 2.00–3.50)
Protime: 24 Seconds — ABNORMAL HIGH (ref 10.6–13.4)

## 2010-11-24 ENCOUNTER — Ambulatory Visit: Payer: Self-pay | Admitting: Oncology

## 2010-11-24 LAB — PROTIME-INR
INR: 2.4 (ref 2.00–3.50)
Protime: 28.8 Seconds — ABNORMAL HIGH (ref 10.6–13.4)

## 2010-12-28 ENCOUNTER — Ambulatory Visit (HOSPITAL_BASED_OUTPATIENT_CLINIC_OR_DEPARTMENT_OTHER): Payer: PRIVATE HEALTH INSURANCE | Admitting: Oncology

## 2011-01-03 LAB — PROTIME-INR
INR: 2.2 (ref 2.00–3.50)
Protime: 26.4 Seconds — ABNORMAL HIGH (ref 10.6–13.4)

## 2011-01-31 ENCOUNTER — Encounter: Payer: PRIVATE HEALTH INSURANCE | Admitting: Oncology

## 2011-01-31 DIAGNOSIS — Z7901 Long term (current) use of anticoagulants: Secondary | ICD-10-CM

## 2011-01-31 DIAGNOSIS — D649 Anemia, unspecified: Secondary | ICD-10-CM

## 2011-01-31 DIAGNOSIS — Z86718 Personal history of other venous thrombosis and embolism: Secondary | ICD-10-CM

## 2011-01-31 LAB — CBC WITH DIFFERENTIAL/PLATELET
BASO%: 1.4 % (ref 0.0–2.0)
Basophils Absolute: 0.1 10*3/uL (ref 0.0–0.1)
EOS%: 5 % (ref 0.0–7.0)
Eosinophils Absolute: 0.3 10*3/uL (ref 0.0–0.5)
HCT: 32.6 % — ABNORMAL LOW (ref 34.8–46.6)
HGB: 11.1 g/dL — ABNORMAL LOW (ref 11.6–15.9)
LYMPH%: 24.9 % (ref 14.0–49.7)
MCH: 29.9 pg (ref 25.1–34.0)
MCHC: 34 g/dL (ref 31.5–36.0)
MCV: 87.9 fL (ref 79.5–101.0)
MONO#: 0.4 10*3/uL (ref 0.1–0.9)
MONO%: 6.5 % (ref 0.0–14.0)
NEUT#: 4.2 10*3/uL (ref 1.5–6.5)
NEUT%: 62.2 % (ref 38.4–76.8)
Platelets: 345 10*3/uL (ref 145–400)
RBC: 3.71 10*6/uL (ref 3.70–5.45)
RDW: 13.5 % (ref 11.2–14.5)
WBC: 6.8 10*3/uL (ref 3.9–10.3)
lymph#: 1.7 10*3/uL (ref 0.9–3.3)

## 2011-01-31 LAB — PROTIME-INR
INR: 2.4 (ref 2.00–3.50)
Protime: 28.8 Seconds — ABNORMAL HIGH (ref 10.6–13.4)

## 2011-02-27 LAB — PROTIME-INR
INR: 2.01 — ABNORMAL HIGH (ref 0.00–1.49)
Prothrombin Time: 22.6 seconds — ABNORMAL HIGH (ref 11.6–15.2)

## 2011-02-27 LAB — PROCALCITONIN: Procalcitonin: <0.5 ng/mL

## 2011-02-27 LAB — URIC ACID: Uric Acid, Serum: 3.7 mg/dL (ref 2.4–7.0)

## 2011-03-19 LAB — DIFFERENTIAL
Basophils Absolute: 0 10*3/uL (ref 0.0–0.1)
Basophils Relative: 1 % (ref 0–1)
Eosinophils Absolute: 0.1 10*3/uL (ref 0.0–0.7)
Eosinophils Relative: 2 % (ref 0–5)
Lymphocytes Relative: 24 % (ref 12–46)
Lymphs Abs: 1.5 10*3/uL (ref 0.7–4.0)
Monocytes Absolute: 0.5 10*3/uL (ref 0.1–1.0)
Monocytes Relative: 8 % (ref 3–12)
Neutro Abs: 4.2 10*3/uL (ref 1.7–7.7)
Neutrophils Relative %: 66 % (ref 43–77)

## 2011-03-19 LAB — BASIC METABOLIC PANEL
BUN: 7 mg/dL (ref 6–23)
CO2: 26 mEq/L (ref 19–32)
Calcium: 9.1 mg/dL (ref 8.4–10.5)
Chloride: 107 mEq/L (ref 96–112)
Creatinine, Ser: 0.62 mg/dL (ref 0.4–1.2)
GFR calc Af Amer: >60 mL/min (ref >60)
GFR calc non Af Amer: >60 mL/min (ref >60)
Glucose, Bld: 89 mg/dL (ref 70–99)
Potassium: 3.9 mEq/L (ref 3.5–5.1)
Sodium: 136 mEq/L (ref 135–145)

## 2011-03-19 LAB — PROTIME-INR
INR: 3 — ABNORMAL HIGH (ref 0.00–1.49)
Prothrombin Time: 30.8 seconds — ABNORMAL HIGH (ref 11.6–15.2)

## 2011-03-19 LAB — CBC
HCT: 33.2 % — ABNORMAL LOW (ref 36.0–46.0)
Hemoglobin: 11.3 g/dL — ABNORMAL LOW (ref 12.0–15.0)
MCHC: 33.9 g/dL (ref 30.0–36.0)
MCV: 86.5 fL (ref 78.0–100.0)
Platelets: 286 10*3/uL (ref 150–400)
RBC: 3.84 MIL/uL — ABNORMAL LOW (ref 3.87–5.11)
RDW: 16.3 % — ABNORMAL HIGH (ref 11.5–15.5)
WBC: 6.3 10*3/uL (ref 4.0–10.5)

## 2011-03-28 ENCOUNTER — Encounter (HOSPITAL_BASED_OUTPATIENT_CLINIC_OR_DEPARTMENT_OTHER): Payer: PRIVATE HEALTH INSURANCE | Admitting: Oncology

## 2011-03-28 ENCOUNTER — Other Ambulatory Visit: Payer: Self-pay | Admitting: Oncology

## 2011-03-28 DIAGNOSIS — Z7901 Long term (current) use of anticoagulants: Secondary | ICD-10-CM

## 2011-03-28 DIAGNOSIS — Z86718 Personal history of other venous thrombosis and embolism: Secondary | ICD-10-CM

## 2011-03-28 DIAGNOSIS — D649 Anemia, unspecified: Secondary | ICD-10-CM

## 2011-03-28 LAB — PROTIME-INR
INR: 2.2 (ref 2.00–3.50)
Protime: 26.4 Seconds — ABNORMAL HIGH (ref 10.6–13.4)

## 2011-04-12 ENCOUNTER — Encounter (HOSPITAL_BASED_OUTPATIENT_CLINIC_OR_DEPARTMENT_OTHER): Payer: PRIVATE HEALTH INSURANCE | Admitting: Oncology

## 2011-04-12 DIAGNOSIS — Z86718 Personal history of other venous thrombosis and embolism: Secondary | ICD-10-CM

## 2011-04-12 DIAGNOSIS — D649 Anemia, unspecified: Secondary | ICD-10-CM

## 2011-04-12 DIAGNOSIS — Z7901 Long term (current) use of anticoagulants: Secondary | ICD-10-CM

## 2011-04-26 ENCOUNTER — Encounter (HOSPITAL_BASED_OUTPATIENT_CLINIC_OR_DEPARTMENT_OTHER): Payer: PRIVATE HEALTH INSURANCE | Admitting: Oncology

## 2011-04-26 ENCOUNTER — Other Ambulatory Visit: Payer: Self-pay | Admitting: Oncology

## 2011-04-26 DIAGNOSIS — D649 Anemia, unspecified: Secondary | ICD-10-CM

## 2011-04-26 DIAGNOSIS — Z7901 Long term (current) use of anticoagulants: Secondary | ICD-10-CM

## 2011-04-26 DIAGNOSIS — Z5181 Encounter for therapeutic drug level monitoring: Secondary | ICD-10-CM

## 2011-04-26 LAB — CBC WITH DIFFERENTIAL/PLATELET
BASO%: 0.5 % (ref 0.0–2.0)
Basophils Absolute: 0 10*3/uL (ref 0.0–0.1)
EOS%: 2.2 % (ref 0.0–7.0)
Eosinophils Absolute: 0.2 10*3/uL (ref 0.0–0.5)
HCT: 34.8 % (ref 34.8–46.6)
HGB: 11.5 g/dL — ABNORMAL LOW (ref 11.6–15.9)
LYMPH%: 28 % (ref 14.0–49.7)
MCH: 28.3 pg (ref 25.1–34.0)
MCHC: 33 g/dL (ref 31.5–36.0)
MCV: 85.5 fL (ref 79.5–101.0)
MONO#: 0.5 10*3/uL (ref 0.1–0.9)
MONO%: 6.8 % (ref 0.0–14.0)
NEUT#: 4.8 10*3/uL (ref 1.5–6.5)
NEUT%: 62.5 % (ref 38.4–76.8)
Platelets: 320 10*3/uL (ref 145–400)
RBC: 4.07 10*6/uL (ref 3.70–5.45)
RDW: 13.6 % (ref 11.2–14.5)
WBC: 7.7 10*3/uL (ref 3.9–10.3)
lymph#: 2.2 10*3/uL (ref 0.9–3.3)
nRBC: 0 % (ref 0–0)

## 2011-04-26 LAB — PROTIME-INR
INR: 2.1 (ref 2.00–3.50)
Protime: 25.2 Seconds — ABNORMAL HIGH (ref 10.6–13.4)

## 2011-04-29 NOTE — Consult Note (Signed)
[facility]  Patient:    [name] [name], [name] [name] Visit Number: [id number] MRN: [id number]          Service Type: MED Location: [id number] Attending Physician:  Cynda Familia Dictated by:   Leighton Roach Truett Perna, M.D. Admit Date:  01/24/2002   CC:         Joycelyn Rua, M.D.   Consultation Report  DATE OF BIRTH:  [birth year]  REFERRING PHYSICIAN:  Joycelyn Rua, M.D.  PATIENT IDENTIFICATION:  [name] is a 50 year old with a history of recurrent left leg DVT.  She is now admitted with an apparent acute DVT while maintained on Warfarin anticoagulation.  HISTORY OF PRESENT ILLNESS:  (No old records are available to me this evening.)  [name] reports that she first developed a left calf DVT in 1997 following a back surgery.  She states that after completing a course of Coumadin anticoagulation she developed a recurrent DVT in the left thigh approximately one year later.  She has been maintained on Warfarin anticoagulation since that time.  She states that she had no apparent risk for developing a recurrent DVT on the second occasion.  She is now aware of undergoing a hypercoagulable work-up in the past.  She states that for the past one year she has noted intermittent swelling and pain in the left leg.  She has been maintained on Coumadin.  Two nights ago she noted cramping pain in the left calf and she sought care with Dr. Lenise Arena today.  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  She denies recent trauma, surgery, prolonged period of bed rest, and travel. She has no other history of venous thrombosis, stroke, or a miscarriage. There is no family history for a DVT or pulmonary embolism.  Aside from the left leg pain she  feels completely well.  PAST MEDICAL HISTORY: 1. G2, P2.  She continues to have monthly menses. 2. Scoliosis status post back surgery approximately 15 years ago with a    revision in 1997. 3. Recurrent left leg DVT as above.  CURRENT MEDICATIONS:  Coumadin 7.5 mg on Tuesday and Saturday, 5 mg other days.  FAMILY HISTORY:  No family history for DVT or pulmonary embolism to her knowledge.  No family history for stroke or heart attack at a young age.  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  She does not use tobacco or alcohol.  She reports receiving a blood transfusion with one of her back surgeries.  REVIEW OF SYSTEMS:  CONSTITUTIONAL:  Negative.  RESPIRATORY:  Negative. GENITOURINARY:  Negative.  GASTROINTESTINAL:  Negative.  MUSCULOSKELETAL:  She has intermittent pain in the left calf with intermittent edema.  SKIN: Negative.  PHYSICAL EXAMINATION  VITAL SIGNS:  Temperature 98.3, pulse 92, respirations 16, blood pressure 140/90, oxygen saturation 99% on room air.  NECK:  Without palpable mass.  ABDOMEN:  Soft, nontender.  I could not feel the liver or spleen.  NODES:  No palpable cervical, clavicular, axillary, or inguinal nodes.  NEUROLOGIC:  She is alert and oriented.  Motor examination appears to be grossly intact.  EXTREMITIES:  I cannot appreciate any edema over either leg.  There are no palpable cords.  There is faint  erythema over the lower inner left thigh extending to the upper inner calf just below the knee.  No leg edema.  The distal pulses are intact over the left lower extremity.  LABORATORIES:  PT 23.2 seconds, INR 2.6, PTT 37 seconds.  Hemoglobin 9.4, hematocrit 29.5%, MCV 70.4, platelets 355,000, white count 7.1.  BUN 10, creatinine 0.7, albumin 4, bilirubin 0.4, sodium 137, potassium 3.9.  IMPRESSION/RECOMMENDATIONS:  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  She is now admitted with left calf pain and a Doppler ultrasound has confirmed evidence of an acute non-obstructing DVT in the left common femoral vein.  The PT/INR is therapeutic.  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  There is no evidence for an underlying systemic condition such as a malignancy that would predispose her to recurrent venous thrombosis while on Coumadin.  There is no family history for venous thromboembolic disease.  I will recommend that she be maintained on Lovenox or heparin anticoagulation at present.  We will need to consider a venogram of the left lower extremity to better define the nature of the current thrombotic event (?acute versus chronic).  If she is proven to have an acute deep venous thrombosis while maintained on therapeutic Warfarin, we will need to consider switching to a long-term heparin based anticoagulation therapy.  We will obtain a hypercoagulable work-up.  We can obtain the factor V Liden and prothrombin gene mutation studies while on Coumadin.  We can also obtain a lupus anticoagulant work-up but we will be unable to interpret protein C and S levels while she is on Coumadin.  She has a microcytic anemia likely related to ongoing menses.  I will read the peripheral blood smear and obtain a ferritin level.  She should have a basic malignancy work-up to include a pelvic examination and a Pap smear.  I will plan to follow her with the hospitalist service while she is admitted and then as needed on an outpatient basis.  Thank you very much for this consultation. Dictated by:   Leighton Roach Truett Perna, M.D. Attending Physician:  Cynda Familia DD:  01/24/02 TD:  01/25/02 Job: [id number]

## 2011-04-29 NOTE — H&P (Signed)
[facility]  Patient:    [name], [name] Visit Number: [id number] MRN: [id number]          Service Type: MED Location: [phone] Attending Physician:  [name] [name] Dictated by:   [name] [name], M.D. Admit Date:  01/24/2002                           History and Physical  CHIEF COMPLAINT:  Left leg pain and swelling.  PROBLEM LIST: 1. Recurrent deep vein thrombosis first noted in 1997, with recurrence in    1998. 2. Scoliosis.  HISTORY OF PRESENT ILLNESS:  The patient is a 50 year old white woman with history of recurrent DVT and scoliosis who presented to her primary M.D. for evaluation of leg pain and swelling.  Outpatient ultrasound revealed nonocclusive DVT of common femoral vein and also showed evidence of previous chronic DVT from the mid ______ vein to the distal with collaterals. According to the patient, she had been in her usual state of health until approximately one month ago when she noted discomfort in her left leg.  She noticed occasional warmth and occasional coolness in her left foot.  She denied any trauma to her leg.  She has not been on any long plane rides or car rides.  The patient also states that she had been compliant with her Coumadin and has not had any complications from her Coumadin, i.e., bleeding, ______. As stated, outpatient ultrasound today revealed nonocclusive DVT.  Therefore, the patient is admitted for evaluation and definitive treatment plan for recurrent DVT in a patient on therapeutic Coumadin presumably.  PAST MEDICAL HISTORY: 1. As stated above, the patient had a DVT in 1997, after having surgery    related to her scoliosis.  The patient had a recurrent DVT in 1998, after    being off Coumadin for undetermined time. 2. Scoliosis, with several back surgeries for correction. 3. The patient denies any high blood pressure, diabetes, MI, CVA.  No cancer.    Denies any family history of DVT, PE, or  miscarriage.  ADMISSION MEDICATIONS: 1. Coumadin 7.5 mg Saturday and Tuesday, other days 5 mg. 2. Occasional Tylenol.  SOCIAL HISTORY:  Negative for tobacco, alcohol, or drugs.  The patient is a housewife.  PAST SURGICAL HISTORY:  The patient had surgery on her back for scoliosis in 1997.  States that she had a previous surgery in her teenaged years for scoliosis as well.  ALLERGIES:  ______ .  FAMILY HISTORY:  Mother had lung cancer.  She was a smoker.  Again, no DVT or PE.  Unsure of the fathers past medical history.  Brothers and sisters healthy.  REVIEW OF SYSTEMS:  Negative for any headaches, dizziness, nausea, vomiting, diarrhea, constipation.  No blood in stool, no blood in urine.  HEALTH MAINTENANCE:  Negative for mammogram, Pap, and pelvic in the last 10 years.  PHYSICAL EXAMINATION:  GENERAL:  Alert and oriented x3.  In mild distress secondary to concern about clot and pain as well.  HEENT:  Pupils are equal, round, and reactive to light.  Anicteric sclerae. No oral lesions.  No nodes.  No JVD.  CHEST:  Clear to auscultation bilaterally.  CARDIOVASCULAR:  Regular, S1, S2.  No S3.  ABDOMEN:  Soft, nontender.  No hepatosplenomegaly.  EXTREMITIES:  Right normal 2+ pulse.  Left 1+ edema, pain in posterior calf. No cord can be palpated.  There are 2+ pulses.  Positive Homans  sign as well.  RECTAL:  Deferred, as the patient is on therapeutic Coumadin.  BREASTS:  Without mass.  No axillary nodes are identified.  LABORATORY DATA:  Pending at the time of this dictation.  ASSESSMENT AND PLAN:  The patient is a 50 year old white female on Coumadin with recurrent deep vein thromboses.  At this time it is felt the patient is either in Coumadin failure or requires a higher dosage of Coumadin.  Since the patients clot is stable, will not proceed with a Greenfield filter.  Will start the patient on Lovenox.  Continue Coumadin at 7.5 mg.  Check baseline laboratories, CBC  CMET, coagulations, and will obtain a hematology consult for assistance with ultimate management for recurrent DVT.  Also, will recommend that the patient follow up with routine cancer screening, age-appropriate, i.e., mammogram, Pap, and pelvic.  As the patient is only 50 and has no family history of colon cancer, I do not recommend colonoscopy at this time. Dictated by:   [name] [name], M.D. Attending Physician:  [name] [name] DD:  01/24/02 TD:  01/24/02 Job: [id number]

## 2011-06-02 ENCOUNTER — Encounter (HOSPITAL_BASED_OUTPATIENT_CLINIC_OR_DEPARTMENT_OTHER): Payer: PRIVATE HEALTH INSURANCE | Admitting: Oncology

## 2011-06-02 ENCOUNTER — Other Ambulatory Visit: Payer: Self-pay | Admitting: Oncology

## 2011-06-02 DIAGNOSIS — Z86718 Personal history of other venous thrombosis and embolism: Secondary | ICD-10-CM

## 2011-06-02 DIAGNOSIS — D649 Anemia, unspecified: Secondary | ICD-10-CM

## 2011-06-02 DIAGNOSIS — Z7901 Long term (current) use of anticoagulants: Secondary | ICD-10-CM

## 2011-06-02 LAB — PROTIME-INR
INR: 1.7 — ABNORMAL LOW (ref 2.00–3.50)
Protime: 20.4 Seconds — ABNORMAL HIGH (ref 10.6–13.4)

## 2011-06-30 ENCOUNTER — Other Ambulatory Visit: Payer: Self-pay | Admitting: Oncology

## 2011-06-30 ENCOUNTER — Encounter (HOSPITAL_BASED_OUTPATIENT_CLINIC_OR_DEPARTMENT_OTHER): Payer: PRIVATE HEALTH INSURANCE | Admitting: Oncology

## 2011-06-30 DIAGNOSIS — D649 Anemia, unspecified: Secondary | ICD-10-CM

## 2011-06-30 DIAGNOSIS — Z7901 Long term (current) use of anticoagulants: Secondary | ICD-10-CM

## 2011-06-30 DIAGNOSIS — Z86718 Personal history of other venous thrombosis and embolism: Secondary | ICD-10-CM

## 2011-06-30 LAB — CBC WITH DIFFERENTIAL/PLATELET
BASO%: 0.5 % (ref 0.0–2.0)
Basophils Absolute: 0 10*3/uL (ref 0.0–0.1)
EOS%: 2.2 % (ref 0.0–7.0)
Eosinophils Absolute: 0.1 10*3/uL (ref 0.0–0.5)
HCT: 34.9 % (ref 34.8–46.6)
HGB: 11.6 g/dL (ref 11.6–15.9)
LYMPH%: 30.7 % (ref 14.0–49.7)
MCH: 28.1 pg (ref 25.1–34.0)
MCHC: 33.2 g/dL (ref 31.5–36.0)
MCV: 84.5 fL (ref 79.5–101.0)
MONO#: 0.4 10*3/uL (ref 0.1–0.9)
MONO%: 6 % (ref 0.0–14.0)
NEUT#: 3.8 10*3/uL (ref 1.5–6.5)
NEUT%: 60.6 % (ref 38.4–76.8)
Platelets: 337 10*3/uL (ref 145–400)
RBC: 4.13 10*6/uL (ref 3.70–5.45)
RDW: 14.4 % (ref 11.2–14.5)
WBC: 6.3 10*3/uL (ref 3.9–10.3)
lymph#: 1.9 10*3/uL (ref 0.9–3.3)
nRBC: 0 % (ref 0–0)

## 2011-06-30 LAB — PROTIME-INR
INR: 2.5 (ref 2.00–3.50)
Protime: 30 Seconds — ABNORMAL HIGH (ref 10.6–13.4)

## 2011-08-01 ENCOUNTER — Other Ambulatory Visit: Payer: Self-pay | Admitting: Oncology

## 2011-08-01 ENCOUNTER — Encounter (HOSPITAL_BASED_OUTPATIENT_CLINIC_OR_DEPARTMENT_OTHER): Payer: PRIVATE HEALTH INSURANCE | Admitting: Oncology

## 2011-08-01 DIAGNOSIS — Z86718 Personal history of other venous thrombosis and embolism: Secondary | ICD-10-CM

## 2011-08-01 DIAGNOSIS — Z7901 Long term (current) use of anticoagulants: Secondary | ICD-10-CM

## 2011-08-01 DIAGNOSIS — D649 Anemia, unspecified: Secondary | ICD-10-CM

## 2011-08-01 LAB — CBC WITH DIFFERENTIAL/PLATELET
BASO%: 0.6 % (ref 0.0–2.0)
Basophils Absolute: 0 10*3/uL (ref 0.0–0.1)
EOS%: 3 % (ref 0.0–7.0)
Eosinophils Absolute: 0.2 10*3/uL (ref 0.0–0.5)
HCT: 32.2 % — ABNORMAL LOW (ref 34.8–46.6)
HGB: 11 g/dL — ABNORMAL LOW (ref 11.6–15.9)
LYMPH%: 32.5 % (ref 14.0–49.7)
MCH: 28.2 pg (ref 25.1–34.0)
MCHC: 34.2 g/dL (ref 31.5–36.0)
MCV: 82.6 fL (ref 79.5–101.0)
MONO#: 0.5 10*3/uL (ref 0.1–0.9)
MONO%: 7.6 % (ref 0.0–14.0)
NEUT#: 3.6 10*3/uL (ref 1.5–6.5)
NEUT%: 56.3 % (ref 38.4–76.8)
Platelets: 382 10*3/uL (ref 145–400)
RBC: 3.9 10*6/uL (ref 3.70–5.45)
RDW: 14.4 % (ref 11.2–14.5)
WBC: 6.4 10*3/uL (ref 3.9–10.3)
lymph#: 2.1 10*3/uL (ref 0.9–3.3)

## 2011-08-01 LAB — PROTIME-INR
INR: 2.4 (ref 2.00–3.50)
Protime: 28.8 Seconds — ABNORMAL HIGH (ref 10.6–13.4)

## 2011-09-12 ENCOUNTER — Encounter (HOSPITAL_BASED_OUTPATIENT_CLINIC_OR_DEPARTMENT_OTHER): Payer: PRIVATE HEALTH INSURANCE | Admitting: Oncology

## 2011-09-12 ENCOUNTER — Other Ambulatory Visit: Payer: Self-pay | Admitting: Oncology

## 2011-09-12 DIAGNOSIS — Z7901 Long term (current) use of anticoagulants: Secondary | ICD-10-CM

## 2011-09-12 DIAGNOSIS — Z86718 Personal history of other venous thrombosis and embolism: Secondary | ICD-10-CM

## 2011-09-12 DIAGNOSIS — D649 Anemia, unspecified: Secondary | ICD-10-CM

## 2011-09-12 LAB — PROTIME-INR
INR: 2.6 (ref 2.00–3.50)
Protime: 31.2 Seconds — ABNORMAL HIGH (ref 10.6–13.4)

## 2011-10-01 ENCOUNTER — Emergency Department (HOSPITAL_COMMUNITY)
Admission: EM | Admit: 2011-10-01 | Discharge: 2011-10-01 | Disposition: A | Discharge status: Home / Self Care | Payer: PRIVATE HEALTH INSURANCE | Attending: Emergency Medicine | Admitting: Emergency Medicine

## 2011-10-01 DIAGNOSIS — Z86718 Personal history of other venous thrombosis and embolism: Secondary | ICD-10-CM | POA: Insufficient documentation

## 2011-10-01 DIAGNOSIS — Z7901 Long term (current) use of anticoagulants: Secondary | ICD-10-CM | POA: Insufficient documentation

## 2011-10-01 DIAGNOSIS — B029 Zoster without complications: Secondary | ICD-10-CM | POA: Insufficient documentation

## 2011-10-24 ENCOUNTER — Other Ambulatory Visit: Payer: Self-pay | Admitting: Oncology

## 2011-10-24 ENCOUNTER — Other Ambulatory Visit: Payer: PRIVATE HEALTH INSURANCE | Admitting: Lab

## 2011-10-24 DIAGNOSIS — D509 Iron deficiency anemia, unspecified: Secondary | ICD-10-CM

## 2011-10-24 DIAGNOSIS — I809 Phlebitis and thrombophlebitis of unspecified site: Secondary | ICD-10-CM | POA: Insufficient documentation

## 2011-10-27 ENCOUNTER — Ambulatory Visit (HOSPITAL_BASED_OUTPATIENT_CLINIC_OR_DEPARTMENT_OTHER): Payer: PRIVATE HEALTH INSURANCE

## 2011-10-27 ENCOUNTER — Telehealth: Payer: Self-pay | Admitting: Oncology

## 2011-10-27 DIAGNOSIS — D509 Iron deficiency anemia, unspecified: Secondary | ICD-10-CM

## 2011-10-27 DIAGNOSIS — I809 Phlebitis and thrombophlebitis of unspecified site: Secondary | ICD-10-CM

## 2011-10-27 DIAGNOSIS — Z5181 Encounter for therapeutic drug level monitoring: Secondary | ICD-10-CM

## 2011-10-27 DIAGNOSIS — Z7901 Long term (current) use of anticoagulants: Secondary | ICD-10-CM

## 2011-10-27 LAB — CBC WITH DIFFERENTIAL/PLATELET
BASO%: 0.7 % (ref 0.0–2.0)
Basophils Absolute: 0.1 10*3/uL (ref 0.0–0.1)
EOS%: 2.9 % (ref 0.0–7.0)
Eosinophils Absolute: 0.2 10*3/uL (ref 0.0–0.5)
HCT: 29.8 % — ABNORMAL LOW (ref 34.8–46.6)
HGB: 9.1 g/dL — ABNORMAL LOW (ref 11.6–15.9)
LYMPH%: 31 % (ref 14.0–49.7)
MCH: 23.6 pg — ABNORMAL LOW (ref 25.1–34.0)
MCHC: 30.5 g/dL — ABNORMAL LOW (ref 31.5–36.0)
MCV: 77.2 fL — ABNORMAL LOW (ref 79.5–101.0)
MONO#: 0.5 10*3/uL (ref 0.1–0.9)
MONO%: 7.1 % (ref 0.0–14.0)
NEUT#: 4.5 10*3/uL (ref 1.5–6.5)
NEUT%: 58.3 % (ref 38.4–76.8)
Platelets: 388 10*3/uL (ref 145–400)
RBC: 3.86 10*6/uL (ref 3.70–5.45)
RDW: 15 % — ABNORMAL HIGH (ref 11.2–14.5)
WBC: 7.6 10*3/uL (ref 3.9–10.3)
lymph#: 2.4 10*3/uL (ref 0.9–3.3)
nRBC: 0 % (ref 0–0)

## 2011-10-27 LAB — PROTIME-INR
INR: 1.7 — ABNORMAL LOW (ref 2.00–3.50)
Protime: 20.4 Seconds — ABNORMAL HIGH (ref 10.6–13.4)

## 2011-10-27 NOTE — Telephone Encounter (Signed)
Pt came in wrong day for appt. 11/12 lb r/s for today. Pt sent to lb and given schedule dec lb/fu.

## 2011-10-28 ENCOUNTER — Telehealth: Payer: Self-pay | Admitting: *Deleted

## 2011-10-28 ENCOUNTER — Other Ambulatory Visit: Payer: Self-pay | Admitting: *Deleted

## 2011-10-28 DIAGNOSIS — D509 Iron deficiency anemia, unspecified: Secondary | ICD-10-CM

## 2011-10-28 NOTE — Telephone Encounter (Signed)
Called pt's home, spoke with husband. CBC and PT INR reviewed by Dr. Truett Perna. Continue same dose of Coumadin, be sure she is taking iron. HGB is low, will check next month. Mr. Botkin verbalized understanding.

## 2011-11-21 ENCOUNTER — Other Ambulatory Visit (HOSPITAL_BASED_OUTPATIENT_CLINIC_OR_DEPARTMENT_OTHER): Payer: PRIVATE HEALTH INSURANCE | Admitting: Lab

## 2011-11-21 ENCOUNTER — Ambulatory Visit (HOSPITAL_BASED_OUTPATIENT_CLINIC_OR_DEPARTMENT_OTHER): Payer: PRIVATE HEALTH INSURANCE | Admitting: Oncology

## 2011-11-21 ENCOUNTER — Telehealth: Payer: Self-pay | Admitting: Oncology

## 2011-11-21 DIAGNOSIS — Z7901 Long term (current) use of anticoagulants: Secondary | ICD-10-CM

## 2011-11-21 DIAGNOSIS — Z86718 Personal history of other venous thrombosis and embolism: Secondary | ICD-10-CM

## 2011-11-21 DIAGNOSIS — D509 Iron deficiency anemia, unspecified: Secondary | ICD-10-CM

## 2011-11-21 DIAGNOSIS — D500 Iron deficiency anemia secondary to blood loss (chronic): Secondary | ICD-10-CM

## 2011-11-21 DIAGNOSIS — I809 Phlebitis and thrombophlebitis of unspecified site: Secondary | ICD-10-CM

## 2011-11-21 LAB — PROTIME-INR
INR: 2.5 (ref 2.00–3.50)
Protime: 30 Seconds — ABNORMAL HIGH (ref 10.6–13.4)

## 2011-11-21 LAB — CBC WITH DIFFERENTIAL/PLATELET
BASO%: 0.4 % (ref 0.0–2.0)
Basophils Absolute: 0 10*3/uL (ref 0.0–0.1)
EOS%: 2 % (ref 0.0–7.0)
Eosinophils Absolute: 0.2 10*3/uL (ref 0.0–0.5)
HCT: 29.5 % — ABNORMAL LOW (ref 34.8–46.6)
HGB: 9 g/dL — ABNORMAL LOW (ref 11.6–15.9)
LYMPH%: 27.3 % (ref 14.0–49.7)
MCH: 22.8 pg — ABNORMAL LOW (ref 25.1–34.0)
MCHC: 30.5 g/dL — ABNORMAL LOW (ref 31.5–36.0)
MCV: 74.7 fL — ABNORMAL LOW (ref 79.5–101.0)
MONO#: 0.6 10*3/uL (ref 0.1–0.9)
MONO%: 7 % (ref 0.0–14.0)
NEUT#: 5.1 10*3/uL (ref 1.5–6.5)
NEUT%: 63.3 % (ref 38.4–76.8)
Platelets: 424 10*3/uL — ABNORMAL HIGH (ref 145–400)
RBC: 3.95 10*6/uL (ref 3.70–5.45)
RDW: 15.6 % — ABNORMAL HIGH (ref 11.2–14.5)
WBC: 8 10*3/uL (ref 3.9–10.3)
lymph#: 2.2 10*3/uL (ref 0.9–3.3)
nRBC: 0 % (ref 0–0)

## 2011-11-21 NOTE — Progress Notes (Signed)
OFFICE PROGRESS NOTE   INTERVAL HISTORY:   [name] returns as scheduled. She continues Coumadin anticoagulation. She denies symptoms of recurrent venous thrombosis. She has noted increased swelling at the foot and ankle bilaterally. She relates this to staying on her feet at work. She is not taking iron. Iron causes constipation.  Objective:  Vital signs in last 24 hours:  Blood pressure 141/83, pulse 72, temperature 97.9 F (36.6 C), temperature source Oral, height 5\' 7"  (1.702 m), weight 226 lb 9.6 oz (102.785 kg).  Resp: Lungs clear bilaterally Cardio: Regular rate and rhythm GI: No hepatosplenomegaly. No mass. Vascular: The left lower leg is slightly larger than the right side with venous varicosities. No erythema. No palpable cord. Trace edema at the left greater than right ankle     Lab Results:  Lab Results  Component Value Date   WBC 8.0 11/21/2011   HGB 9.0* 11/21/2011   HCT 29.5* 11/21/2011   MCV 74.7* 11/21/2011   PLT 424* 11/21/2011   PT/INR 2.5   Medications: I have reviewed the patient's current medications.  Assessment/Plan: 1. 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 The PT is therapeutic. 2. 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  Repeat lupus and anticardiolipin antibody testing was negative until there was a positive lupus anticoagulant panel on 05/27/2008.  There was a positive lupus anticoagulant and hexagonal phase study on 10/27/2009.  She feels most comfortable continuing indefinite anticoagulation therapy. 3. Anemia secondary to menorrhagia and iron deficiency, she remains anemic and the MCV is lower. I recommended she resume iron therapy. 4. Chronic bilateral leg pain.    Disposition:  She continues Coumadin anticoagulation. There is no evidence of recurrent venous thrombosis. She'll return for a PT in one month. She will resume ferrous  sulfate twice daily. We will check a CBC when she returns next month. She is scheduled for a 6 month office visit.   Lucile Shutters, MD  11/21/2011  4:34 PM

## 2011-11-21 NOTE — Telephone Encounter (Signed)
gve the pt her jan,june 2013 appt calendaR

## 2011-12-14 ENCOUNTER — Other Ambulatory Visit: Payer: PRIVATE HEALTH INSURANCE

## 2011-12-21 ENCOUNTER — Other Ambulatory Visit: Payer: Self-pay | Admitting: *Deleted

## 2011-12-21 ENCOUNTER — Encounter: Payer: Self-pay | Admitting: *Deleted

## 2011-12-21 DIAGNOSIS — D649 Anemia, unspecified: Secondary | ICD-10-CM

## 2011-12-21 DIAGNOSIS — Z86718 Personal history of other venous thrombosis and embolism: Secondary | ICD-10-CM

## 2011-12-21 MED ORDER — INTEGRA 62.5-62.5-40-3 MG PO CAPS: 1.0000 | ORAL_CAPSULE | Freq: Every day | ORAL | Status: DC

## 2012-01-18 ENCOUNTER — Other Ambulatory Visit: Payer: Self-pay | Admitting: Oncology

## 2012-01-18 DIAGNOSIS — C8589 Other specified types of non-Hodgkin lymphoma, extranodal and solid organ sites: Secondary | ICD-10-CM

## 2012-05-16 IMAGING — CR DG TOE GREAT 2+V*L*
3 series · 3 of 3 positions shown · non-contrast
Comparison: None

CLINICAL DATA: First toe pain, swelling.

LEFT TOE - 2+ VIEW

[t toes ap left]
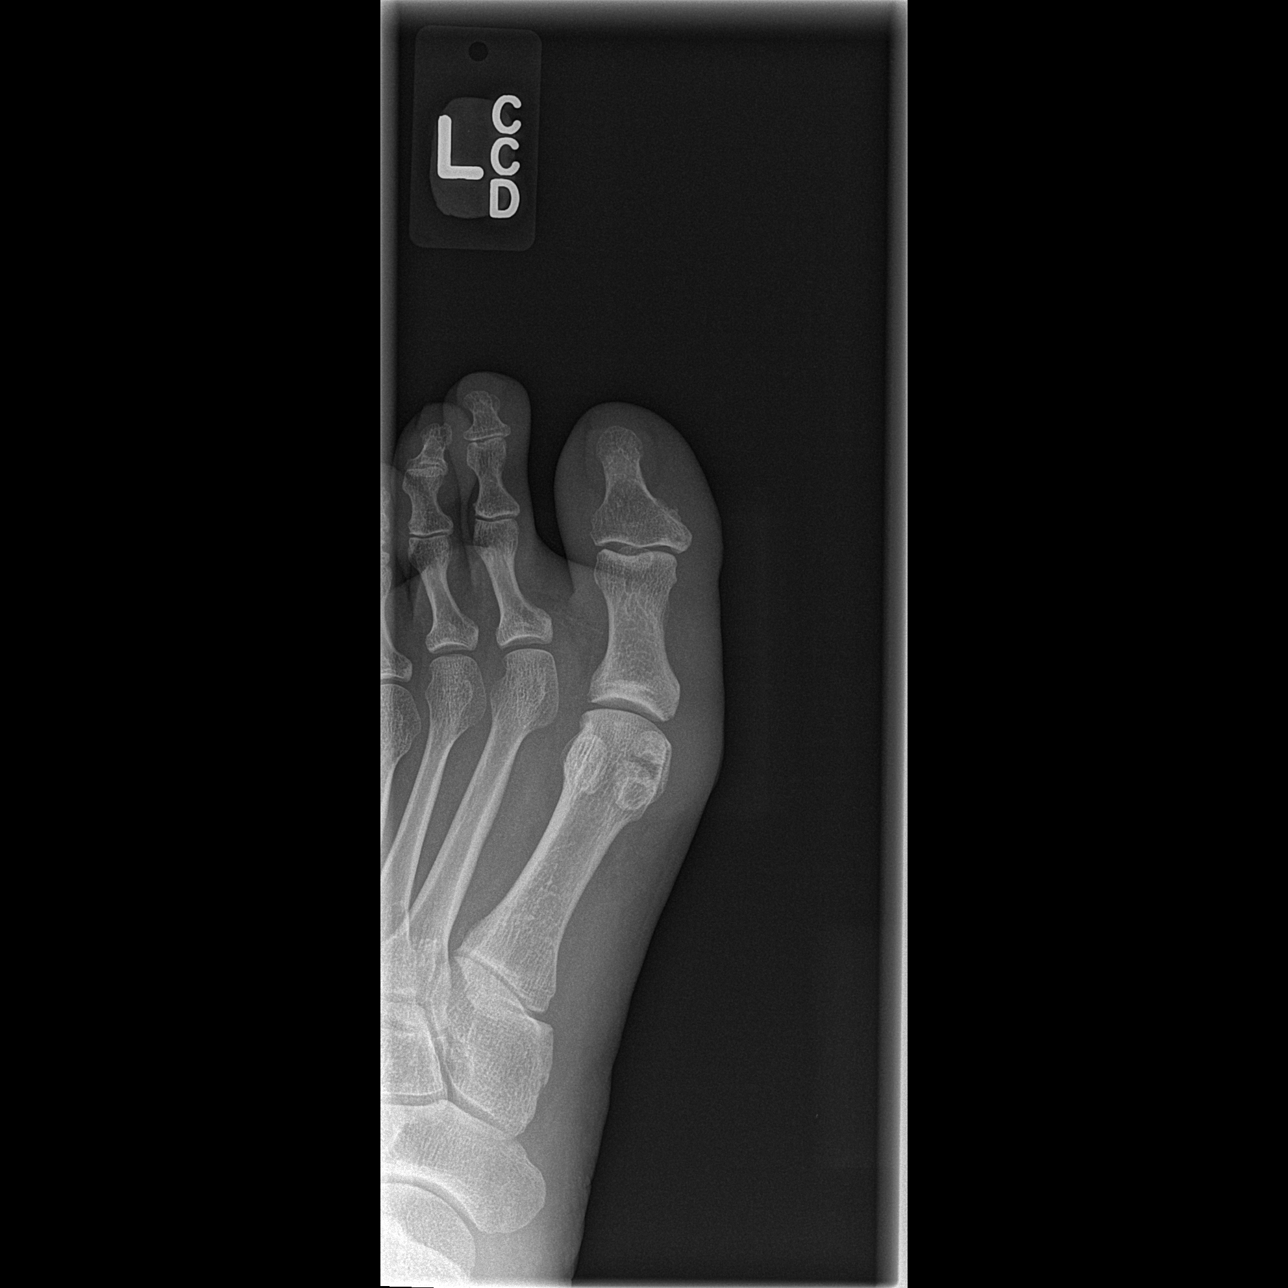

[t toes oblique left]
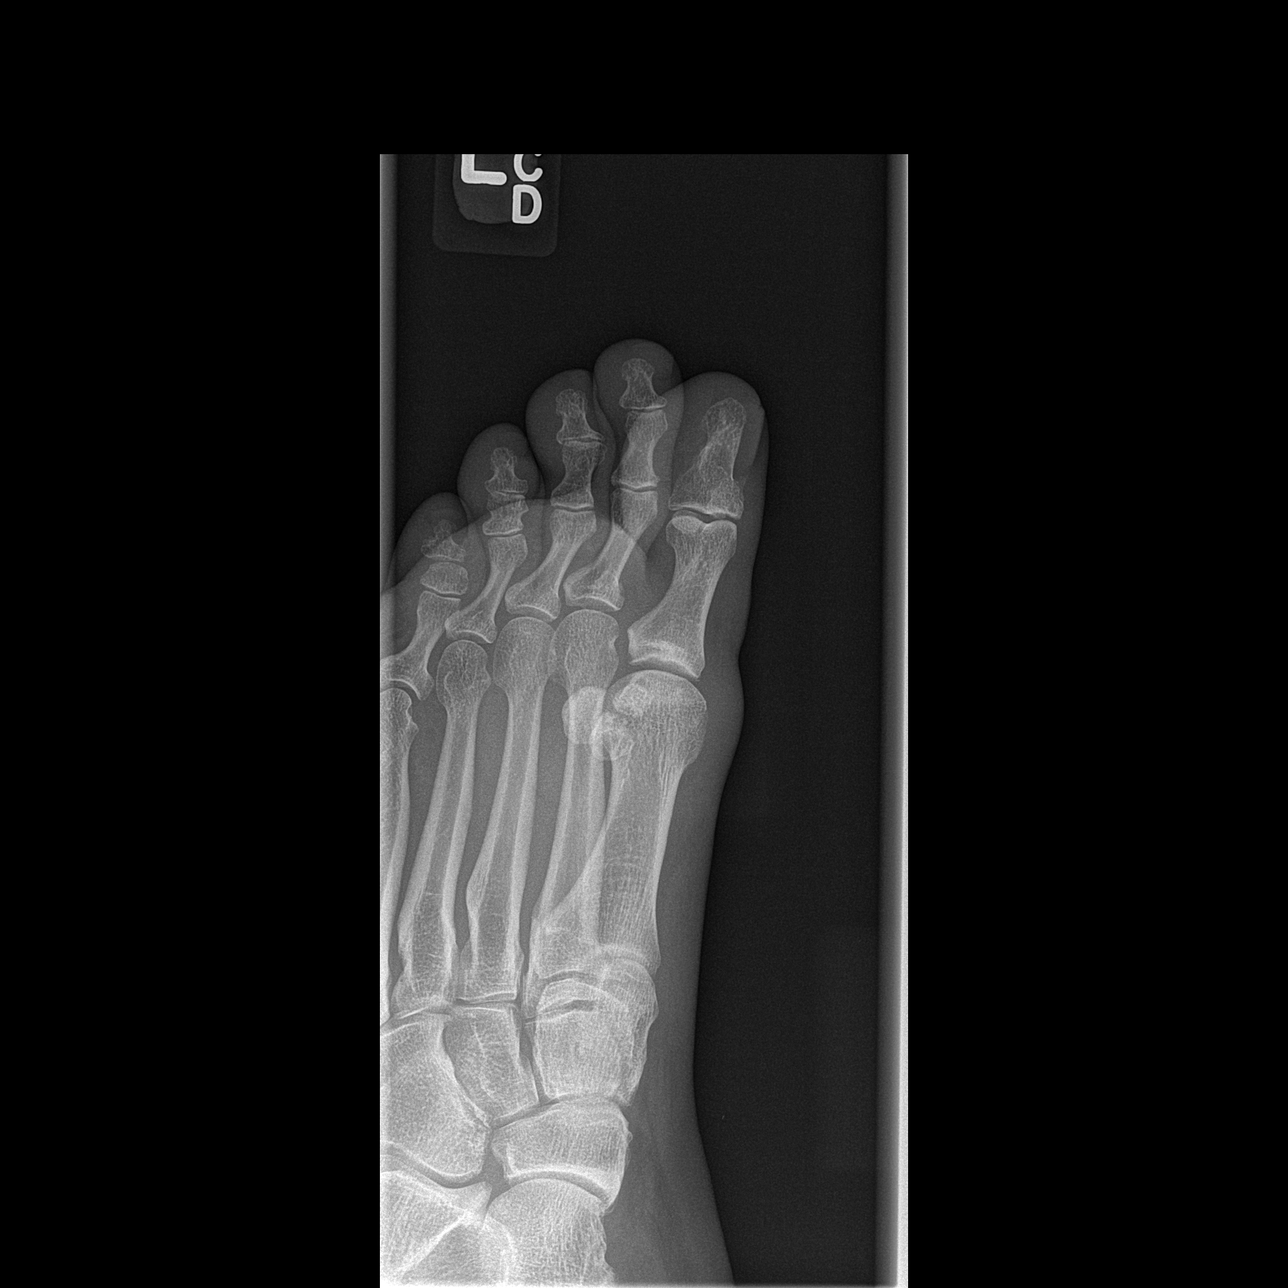

[t toes lateral left]
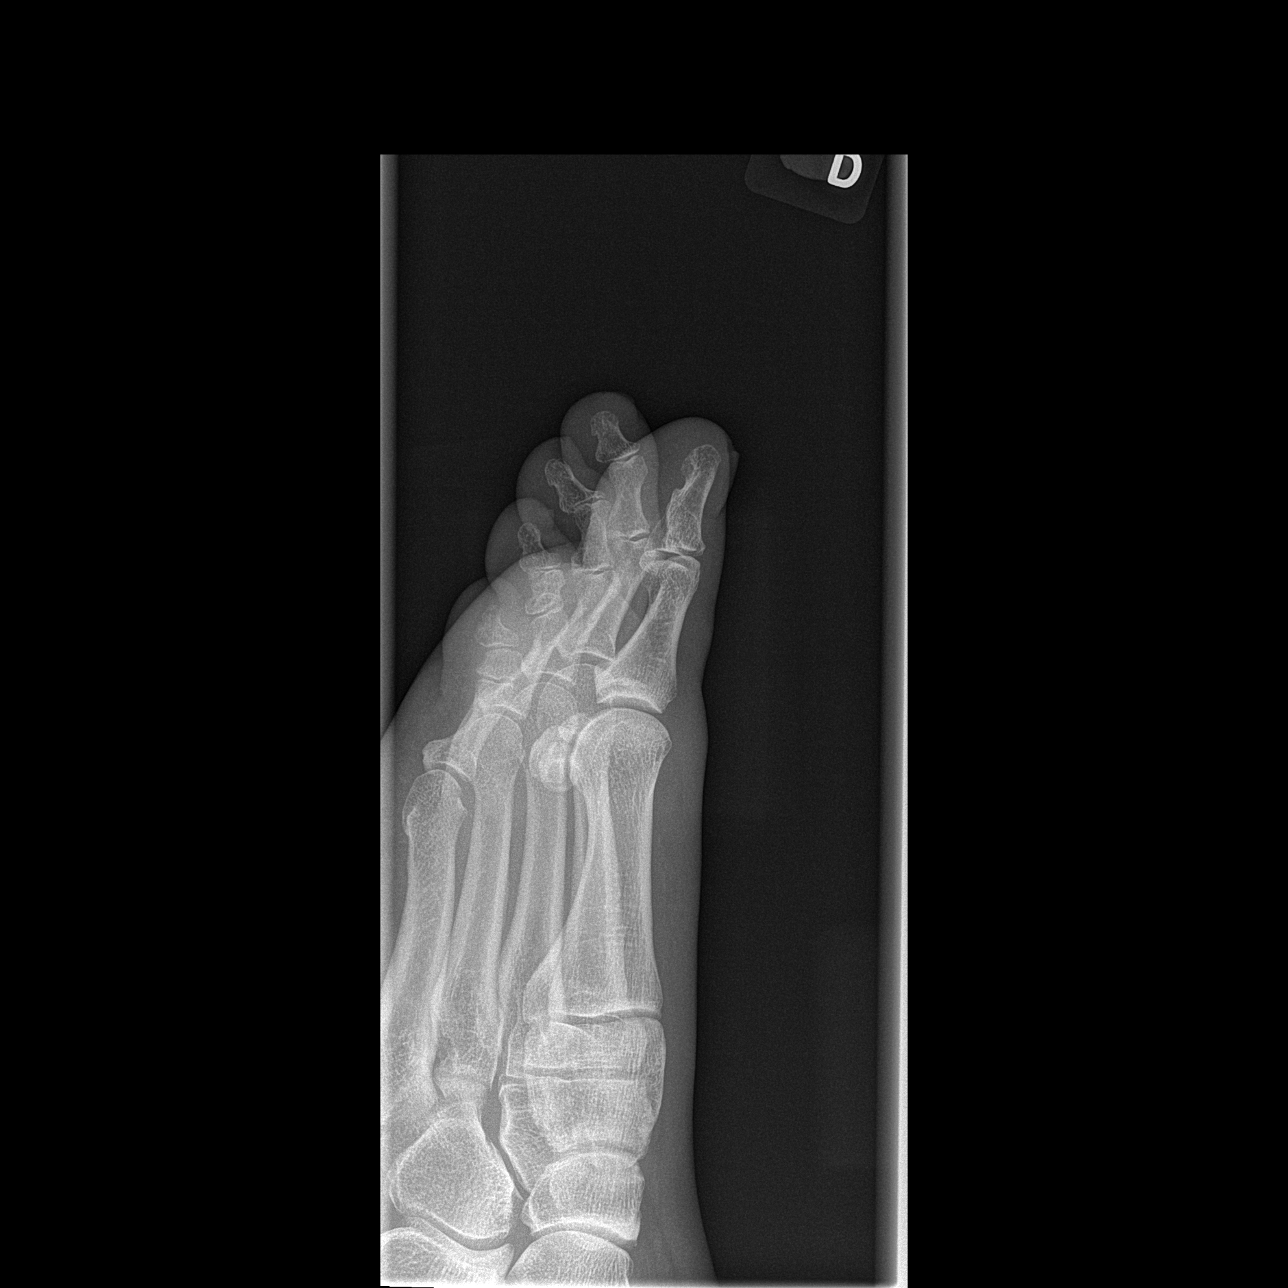

[3 of 3 positions shown; findings below may reference images not displayed]

FINDINGS: No acute bony abnormality.  Specifically, no fracture,
subluxation, or dislocation.  Soft tissues are intact.
IMPRESSION: Negative.

## 2012-05-21 ENCOUNTER — Telehealth: Payer: Self-pay | Admitting: Oncology

## 2012-05-21 ENCOUNTER — Other Ambulatory Visit (HOSPITAL_BASED_OUTPATIENT_CLINIC_OR_DEPARTMENT_OTHER): Payer: PRIVATE HEALTH INSURANCE | Admitting: Lab

## 2012-05-21 ENCOUNTER — Ambulatory Visit (HOSPITAL_BASED_OUTPATIENT_CLINIC_OR_DEPARTMENT_OTHER): Payer: PRIVATE HEALTH INSURANCE | Admitting: Oncology

## 2012-05-21 VITALS — BP 137/89 | HR 76 | Temp 96.3°F | Ht 67.0 in | Wt 206.0 lb

## 2012-05-21 DIAGNOSIS — D509 Iron deficiency anemia, unspecified: Secondary | ICD-10-CM

## 2012-05-21 DIAGNOSIS — D649 Anemia, unspecified: Secondary | ICD-10-CM

## 2012-05-21 DIAGNOSIS — Z7901 Long term (current) use of anticoagulants: Secondary | ICD-10-CM

## 2012-05-21 DIAGNOSIS — N920 Excessive and frequent menstruation with regular cycle: Secondary | ICD-10-CM

## 2012-05-21 DIAGNOSIS — I82409 Acute embolism and thrombosis of unspecified deep veins of unspecified lower extremity: Secondary | ICD-10-CM

## 2012-05-21 DIAGNOSIS — D6859 Other primary thrombophilia: Secondary | ICD-10-CM

## 2012-05-21 LAB — CBC WITH DIFFERENTIAL/PLATELET
BASO%: 0.6 % (ref 0.0–2.0)
Basophils Absolute: 0 10*3/uL (ref 0.0–0.1)
EOS%: 4.1 % (ref 0.0–7.0)
Eosinophils Absolute: 0.3 10*3/uL (ref 0.0–0.5)
HCT: 35.7 % (ref 34.8–46.6)
HGB: 11.7 g/dL (ref 11.6–15.9)
LYMPH%: 30.5 % (ref 14.0–49.7)
MCH: 26.8 pg (ref 25.1–34.0)
MCHC: 32.8 g/dL (ref 31.5–36.0)
MCV: 81.7 fL (ref 79.5–101.0)
MONO#: 0.5 10*3/uL (ref 0.1–0.9)
MONO%: 7.6 % (ref 0.0–14.0)
NEUT#: 3.8 10*3/uL (ref 1.5–6.5)
NEUT%: 57.2 % (ref 38.4–76.8)
Platelets: 330 10*3/uL (ref 145–400)
RBC: 4.37 10*6/uL (ref 3.70–5.45)
RDW: 14.6 % — ABNORMAL HIGH (ref 11.2–14.5)
WBC: 6.6 10*3/uL (ref 3.9–10.3)
lymph#: 2 10*3/uL (ref 0.9–3.3)
nRBC: 0 % (ref 0–0)

## 2012-05-21 LAB — PROTIME-INR
INR: 2 (ref 2.00–3.50)
Protime: 24 Seconds — ABNORMAL HIGH (ref 10.6–13.4)

## 2012-05-21 NOTE — Telephone Encounter (Signed)
appts made and printed for pt aom °

## 2012-05-21 NOTE — Progress Notes (Signed)
   [facility]    OFFICE PROGRESS NOTE   INTERVAL HISTORY:   She returns as scheduled. The PT was last checked in December of 2012. She missed an appointment after she developed the "shingles "and was not rescheduled. No bleeding. She reports no menstrual cycle for the past few months. No symptoms of recurrent venous thrombosis. No new complaint.  Objective:  Vital signs in last 24 hours:  Blood pressure 137/89, pulse 76, temperature 96.3 F (35.7 C), temperature source Oral, height 5\' 7"  (1.702 m), weight 206 lb (93.441 kg).    Resp: Lungs clear bilaterally Cardio: Regular rate and rhythm GI: No hepatosplenomegaly Vascular: The left lower leg is slightly larger than the right side, no erythema. There are venous varicosities.     Lab Results:  Lab Results  Component Value Date   WBC 6.6 05/21/2012   HGB 11.7 05/21/2012   HCT 35.7 05/21/2012   MCV 81.7 05/21/2012   PLT 330 05/21/2012   ANC 3.8    Medications: I have reviewed the patient's current medications.  Assessment/Plan: 1. 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 The PT is therapeutic. 2. 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 Repeat lupus and anticardiolipin antibody testing was negative until there was a positive lupus anticoagulant panel on 05/27/2008. There was a positive lupus anticoagulant and hexagonal phase study on 10/27/2009. She feels most comfortable continuing indefinite anticoagulation therapy. 3. Anemia secondary to menorrhagia and iron deficiency, the anemia has improved  4. Chronic bilateral leg pain.    Disposition:  She appears stable. The plan is to continue indefinite Coumadin anticoagulation. She will return for a PT in one month. The hemoglobin has improved significantly.  [name] [name] returns for an office visit and CBC/PT in 6 months.   Thornton Papas, MD  05/21/2012    6:19 PM

## 2012-05-22 ENCOUNTER — Encounter: Payer: Self-pay | Admitting: *Deleted

## 2012-06-28 ENCOUNTER — Other Ambulatory Visit (HOSPITAL_BASED_OUTPATIENT_CLINIC_OR_DEPARTMENT_OTHER): Payer: PRIVATE HEALTH INSURANCE

## 2012-06-28 ENCOUNTER — Telehealth: Payer: Self-pay | Admitting: *Deleted

## 2012-06-28 DIAGNOSIS — I809 Phlebitis and thrombophlebitis of unspecified site: Secondary | ICD-10-CM

## 2012-06-28 DIAGNOSIS — I82409 Acute embolism and thrombosis of unspecified deep veins of unspecified lower extremity: Secondary | ICD-10-CM

## 2012-06-28 DIAGNOSIS — Z7901 Long term (current) use of anticoagulants: Secondary | ICD-10-CM

## 2012-06-28 LAB — PROTIME-INR
INR: 1.7 — ABNORMAL LOW (ref 2.00–3.50)
Protime: 20.4 Seconds — ABNORMAL HIGH (ref 10.6–13.4)

## 2012-06-28 NOTE — Telephone Encounter (Signed)
Pt notified to stay on same dose of coumadin=5 mg M W F & 7.5 mg other days.  Order placed for repeat lab in 1 mo.  Scheduler will call pt.

## 2012-06-29 ENCOUNTER — Telehealth: Payer: Self-pay | Admitting: *Deleted

## 2012-06-29 NOTE — Telephone Encounter (Signed)
left voice message to inform the patient of the new date and time of the lab only appointment on 07-24-2012 at 10:30am

## 2012-07-24 ENCOUNTER — Other Ambulatory Visit: Payer: PRIVATE HEALTH INSURANCE | Admitting: Lab

## 2012-11-10 ENCOUNTER — Other Ambulatory Visit: Payer: Self-pay | Admitting: Oncology

## 2012-11-10 DIAGNOSIS — I82409 Acute embolism and thrombosis of unspecified deep veins of unspecified lower extremity: Secondary | ICD-10-CM

## 2012-11-15 ENCOUNTER — Other Ambulatory Visit (HOSPITAL_BASED_OUTPATIENT_CLINIC_OR_DEPARTMENT_OTHER): Payer: PRIVATE HEALTH INSURANCE | Admitting: Lab

## 2012-11-15 ENCOUNTER — Ambulatory Visit (HOSPITAL_BASED_OUTPATIENT_CLINIC_OR_DEPARTMENT_OTHER): Payer: PRIVATE HEALTH INSURANCE | Admitting: Oncology

## 2012-11-15 ENCOUNTER — Telehealth: Payer: Self-pay | Admitting: Oncology

## 2012-11-15 VITALS — BP 134/75 | HR 70 | Temp 96.9°F | Resp 18 | Ht 67.0 in | Wt 203.3 lb

## 2012-11-15 DIAGNOSIS — D509 Iron deficiency anemia, unspecified: Secondary | ICD-10-CM

## 2012-11-15 DIAGNOSIS — N920 Excessive and frequent menstruation with regular cycle: Secondary | ICD-10-CM

## 2012-11-15 DIAGNOSIS — Z86718 Personal history of other venous thrombosis and embolism: Secondary | ICD-10-CM

## 2012-11-15 DIAGNOSIS — R894 Abnormal immunological findings in specimens from other organs, systems and tissues: Secondary | ICD-10-CM

## 2012-11-15 DIAGNOSIS — I82409 Acute embolism and thrombosis of unspecified deep veins of unspecified lower extremity: Secondary | ICD-10-CM

## 2012-11-15 DIAGNOSIS — D500 Iron deficiency anemia secondary to blood loss (chronic): Secondary | ICD-10-CM

## 2012-11-15 DIAGNOSIS — I809 Phlebitis and thrombophlebitis of unspecified site: Secondary | ICD-10-CM

## 2012-11-15 LAB — CBC WITH DIFFERENTIAL/PLATELET
BASO%: 0.6 % (ref 0.0–2.0)
Basophils Absolute: 0 10*3/uL (ref 0.0–0.1)
EOS%: 3.4 % (ref 0.0–7.0)
Eosinophils Absolute: 0.2 10*3/uL (ref 0.0–0.5)
HCT: 33.4 % — ABNORMAL LOW (ref 34.8–46.6)
HGB: 10.7 g/dL — ABNORMAL LOW (ref 11.6–15.9)
LYMPH%: 35.9 % (ref 14.0–49.7)
MCH: 27.5 pg (ref 25.1–34.0)
MCHC: 32 g/dL (ref 31.5–36.0)
MCV: 85.9 fL (ref 79.5–101.0)
MONO#: 0.4 10*3/uL (ref 0.1–0.9)
MONO%: 7.5 % (ref 0.0–14.0)
NEUT#: 2.6 10*3/uL (ref 1.5–6.5)
NEUT%: 52.6 % (ref 38.4–76.8)
Platelets: 296 10*3/uL (ref 145–400)
RBC: 3.89 10*6/uL (ref 3.70–5.45)
RDW: 14.4 % (ref 11.2–14.5)
WBC: 4.9 10*3/uL (ref 3.9–10.3)
lymph#: 1.8 10*3/uL (ref 0.9–3.3)
nRBC: 0 % (ref 0–0)

## 2012-11-15 LAB — PROTIME-INR
INR: 2.1 (ref 2.00–3.50)
Protime: 25.2 Seconds — ABNORMAL HIGH (ref 10.6–13.4)

## 2012-11-15 NOTE — Progress Notes (Signed)
   [place]    OFFICE PROGRESS NOTE   INTERVAL HISTORY:   She returns as scheduled. She continues Coumadin anticoagulation. No symptom of recurrent venous thrombosis. No bleeding aside from the menstrual cycle. She reports no menstrual cycle for several months until this week. She is having hot flashes. She currently has a heavy menstrual cycle. She is no longer taking iron. No other complaint.  Objective:  Vital signs in last 24 hours:  Blood pressure 134/75, pulse 70, temperature 96.9 F (36.1 C), temperature source Oral, resp. rate 18, height 5\' 7"  (1.702 m), weight 203 lb 4.8 oz (92.216 kg).    HEENT: Neck without mass Lymphatics: No cervical or supraclavicular nodes Resp: Lungs clear bilaterally Cardio: Regular rate and rhythm GI: No hepatosplenomegaly Vascular: No leg edema   Lab Results:  Lab Results  Component Value Date   WBC 4.9 11/15/2012   HGB 10.7* 11/15/2012   HCT 33.4* 11/15/2012   MCV 85.9 11/15/2012   PLT 296 11/15/2012   ANC 2.6  Hemoglobin 11.7, MCV 81.7 on 05/21/2012  PT/INR 2.1  Medications: I have reviewed the patient's current medications.  Assessment/Plan: 1. 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 The PT is therapeutic. 2. 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 Repeat lupus and anticardiolipin antibody testing was negative until there was a positive lupus anticoagulant panel on 05/27/2008. There was a positive lupus anticoagulant and hexagonal phase study on 10/27/2009. She she would like to continue Coumadin anticoagulation.  3. Anemia secondary to menorrhagia and iron deficiency, the hemoglobin is lower today 4. Chronic bilateral leg pain.   Disposition:  The PT/INR is therapeutic. She will continue Coumadin. Ms. Yzaguirre will resume iron therapy. The anemia may be related to iron deficiency versus menstrual blood loss. A  CBC will be repeated in 2 months. I recommended she followup with her gynecologist to evaluate the irregular menses.   Thornton Papas, MD  11/15/2012  11:06 AM

## 2012-11-15 NOTE — Telephone Encounter (Signed)
appts made and printed for pt aom °

## 2012-12-13 ENCOUNTER — Other Ambulatory Visit (HOSPITAL_BASED_OUTPATIENT_CLINIC_OR_DEPARTMENT_OTHER): Payer: PRIVATE HEALTH INSURANCE | Admitting: Lab

## 2012-12-13 ENCOUNTER — Other Ambulatory Visit: Payer: PRIVATE HEALTH INSURANCE | Admitting: Lab

## 2012-12-13 DIAGNOSIS — I809 Phlebitis and thrombophlebitis of unspecified site: Secondary | ICD-10-CM

## 2012-12-13 LAB — PROTIME-INR
INR: 1.9 — ABNORMAL LOW (ref 2.00–3.50)
Protime: 22.8 Seconds — ABNORMAL HIGH (ref 10.6–13.4)

## 2012-12-14 ENCOUNTER — Telehealth: Payer: Self-pay | Admitting: *Deleted

## 2012-12-14 NOTE — Telephone Encounter (Signed)
Message copied by Gala Romney on Fri Dec 14, 2012 12:28 PM ------      Message from: Thornton Papas B      Created: Thu Dec 13, 2012  9:12 PM       Please call patient, same coumadin, pt 76mo.

## 2012-12-14 NOTE — Telephone Encounter (Signed)
Left message on known voice mail to stay on same dose of coumadin, re-check lab in 1 month; call if questions.  Appt scheduled for 01/10/13.

## 2013-01-10 ENCOUNTER — Other Ambulatory Visit: Payer: PRIVATE HEALTH INSURANCE | Admitting: Lab

## 2013-05-08 ENCOUNTER — Other Ambulatory Visit: Payer: Self-pay | Admitting: *Deleted

## 2013-05-08 ENCOUNTER — Telehealth: Payer: Self-pay | Admitting: Oncology

## 2013-05-08 NOTE — Telephone Encounter (Signed)
Gave pt appt for lab and for 05/16/13

## 2013-05-16 ENCOUNTER — Ambulatory Visit (HOSPITAL_BASED_OUTPATIENT_CLINIC_OR_DEPARTMENT_OTHER): Payer: PRIVATE HEALTH INSURANCE | Admitting: Nurse Practitioner

## 2013-05-16 ENCOUNTER — Other Ambulatory Visit (HOSPITAL_BASED_OUTPATIENT_CLINIC_OR_DEPARTMENT_OTHER): Payer: PRIVATE HEALTH INSURANCE | Admitting: Lab

## 2013-05-16 ENCOUNTER — Telehealth: Payer: Self-pay | Admitting: Oncology

## 2013-05-16 VITALS — BP 147/80 | HR 95 | Temp 97.7°F | Resp 18 | Ht 67.0 in | Wt 209.6 lb

## 2013-05-16 DIAGNOSIS — D509 Iron deficiency anemia, unspecified: Secondary | ICD-10-CM

## 2013-05-16 DIAGNOSIS — Z7901 Long term (current) use of anticoagulants: Secondary | ICD-10-CM

## 2013-05-16 DIAGNOSIS — N920 Excessive and frequent menstruation with regular cycle: Secondary | ICD-10-CM

## 2013-05-16 DIAGNOSIS — I809 Phlebitis and thrombophlebitis of unspecified site: Secondary | ICD-10-CM

## 2013-05-16 DIAGNOSIS — D6859 Other primary thrombophilia: Secondary | ICD-10-CM

## 2013-05-16 LAB — CBC WITH DIFFERENTIAL/PLATELET
BASO%: 0.6 % (ref 0.0–2.0)
Basophils Absolute: 0 10*3/uL (ref 0.0–0.1)
EOS%: 3.8 % (ref 0.0–7.0)
Eosinophils Absolute: 0.2 10*3/uL (ref 0.0–0.5)
HCT: 33.1 % — ABNORMAL LOW (ref 34.8–46.6)
HGB: 10.7 g/dL — ABNORMAL LOW (ref 11.6–15.9)
LYMPH%: 29.6 % (ref 14.0–49.7)
MCH: 27.2 pg (ref 25.1–34.0)
MCHC: 32.3 g/dL (ref 31.5–36.0)
MCV: 84.2 fL (ref 79.5–101.0)
MONO#: 0.4 10*3/uL (ref 0.1–0.9)
MONO%: 7.4 % (ref 0.0–14.0)
NEUT#: 2.8 10*3/uL (ref 1.5–6.5)
NEUT%: 58.6 % (ref 38.4–76.8)
Platelets: 380 10*3/uL (ref 145–400)
RBC: 3.93 10*6/uL (ref 3.70–5.45)
RDW: 14.9 % — ABNORMAL HIGH (ref 11.2–14.5)
WBC: 4.7 10*3/uL (ref 3.9–10.3)
lymph#: 1.4 10*3/uL (ref 0.9–3.3)

## 2013-05-16 LAB — PROTIME-INR
INR: 2.1 (ref 2.00–3.50)
Protime: 25.2 Seconds — ABNORMAL HIGH (ref 10.6–13.4)

## 2013-05-16 NOTE — Progress Notes (Signed)
OFFICE PROGRESS NOTE  Interval history:  [name] [name] returns as scheduled. She continues Coumadin. She did not begin oral iron as recommended by [name] [name] following her last visit 11/15/2012. She denies leg swelling or calf pain. No shortness of breath or chest pain. She reports the past 2 monthly menstrual cycles have been heavier and lasted longer than typical. She denies other bleeding.   Objective: Blood pressure 147/80, pulse 95, temperature 97.7 F (36.5 C), temperature source Oral, resp. rate 18, height 5\' 7"  (1.702 m), weight 209 lb 9.6 oz (95.074 kg), SpO2 100.00%.  No thrush or ulcerations. Lungs are clear. Regular cardiac rhythm. Abdomen is soft and nontender. No organomegaly. Extremities are without edema.  Lab Results: Lab Results  Component Value Date   WBC 4.7 05/16/2013   HGB 10.7* 05/16/2013   HCT 33.1* 05/16/2013   MCV 84.2 05/16/2013   PLT 380 05/16/2013    Chemistry:    Chemistry      Component Value Date/Time   NA 136 08/02/2009 1340   K 3.9 08/02/2009 1340   CL 107 08/02/2009 1340   CO2 26 08/02/2009 1340   BUN 7 08/02/2009 1340   CREATININE 0.62 08/02/2009 1340      Component Value Date/Time   CALCIUM 9.1 08/02/2009 1340       Studies/Results: No results found.  Medications: I have reviewed the patient's current medications.  Assessment/Plan:  1. 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 The PT is therapeutic. 2. 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 Repeat lupus and anticardiolipin antibody testing was negative until there was a positive lupus anticoagulant panel on 05/27/2008. There was a positive lupus anticoagulant and hexagonal phase study on 10/27/2009. She would like to continue Coumadin anticoagulation.  3. Anemia secondary to menorrhagia and iron deficiency. The hemoglobin is stable as compared to 6 months ago. 4. Chronic bilateral leg pain.    Disposition-she appears stable. The PT/INR is therapeutic. She will continue Coumadin at the current dose and return in one month for a PT/INR. We recommended she resume oral iron. We will check a ferritin when she returns for the next lab test. We also recommended she followup with her gynecologist to evaluate the menorrhagia.  She will return for a followup visit in 6 months. She will contact the office in the interim with any problems.  Plan reviewed with [name] [name].  [name] [name] ANP/GNP-BC

## 2013-06-09 ENCOUNTER — Other Ambulatory Visit: Payer: Self-pay | Admitting: Oncology

## 2013-06-10 ENCOUNTER — Telehealth: Payer: Self-pay | Admitting: *Deleted

## 2013-06-10 MED ORDER — WARFARIN SODIUM 5 MG PO TABS: ORAL_TABLET | ORAL | Status: DC

## 2013-06-10 NOTE — Telephone Encounter (Signed)
Call from pt requesting refill on Coumadin. Rx sent electronically, per Dr. Truett Perna.

## 2013-06-13 ENCOUNTER — Other Ambulatory Visit (HOSPITAL_BASED_OUTPATIENT_CLINIC_OR_DEPARTMENT_OTHER): Payer: PRIVATE HEALTH INSURANCE

## 2013-06-13 ENCOUNTER — Other Ambulatory Visit: Payer: PRIVATE HEALTH INSURANCE | Admitting: Lab

## 2013-06-13 DIAGNOSIS — D509 Iron deficiency anemia, unspecified: Secondary | ICD-10-CM

## 2013-06-13 DIAGNOSIS — I809 Phlebitis and thrombophlebitis of unspecified site: Secondary | ICD-10-CM

## 2013-06-13 LAB — CBC WITH DIFFERENTIAL/PLATELET
BASO%: 0.9 % (ref 0.0–2.0)
Basophils Absolute: 0 10*3/uL (ref 0.0–0.1)
EOS%: 5.2 % (ref 0.0–7.0)
Eosinophils Absolute: 0.3 10*3/uL (ref 0.0–0.5)
HCT: 35.5 % (ref 34.8–46.6)
HGB: 11.9 g/dL (ref 11.6–15.9)
LYMPH%: 34 % (ref 14.0–49.7)
MCH: 28 pg (ref 25.1–34.0)
MCHC: 33.6 g/dL (ref 31.5–36.0)
MCV: 83.3 fL (ref 79.5–101.0)
MONO#: 0.3 10*3/uL (ref 0.1–0.9)
MONO%: 6 % (ref 0.0–14.0)
NEUT#: 2.8 10*3/uL (ref 1.5–6.5)
NEUT%: 53.9 % (ref 38.4–76.8)
Platelets: 276 10*3/uL (ref 145–400)
RBC: 4.26 10*6/uL (ref 3.70–5.45)
RDW: 15.9 % — ABNORMAL HIGH (ref 11.2–14.5)
WBC: 5.1 10*3/uL (ref 3.9–10.3)
lymph#: 1.7 10*3/uL (ref 0.9–3.3)

## 2013-06-13 LAB — PROTIME-INR
INR: 2.1 (ref 2.00–3.50)
Protime: 25.2 Seconds — ABNORMAL HIGH (ref 10.6–13.4)

## 2013-06-13 LAB — FERRITIN CHCC: Ferritin: 20 ng/ml (ref 9–269)

## 2013-06-19 ENCOUNTER — Telehealth: Payer: Self-pay | Admitting: *Deleted

## 2013-06-19 NOTE — Telephone Encounter (Signed)
Pt returned call, instructed her to continue same dose of Coumadin and continue iron. Lab results given. She voiced understanding.

## 2013-06-19 NOTE — Telephone Encounter (Signed)
Message copied by Caleb Popp on Wed Jun 19, 2013  1:48 PM ------      Message from: [name] [name]      Created: Mon Jun 17, 2013  7:45 PM       Please call patient, same coumadin, hb is better, cont. iron ------

## 2013-07-15 ENCOUNTER — Telehealth: Payer: Self-pay | Admitting: Oncology

## 2013-07-15 NOTE — Telephone Encounter (Signed)
pt called to r/s lab to 8.5.14 due to being off of work....done

## 2013-07-16 ENCOUNTER — Other Ambulatory Visit (HOSPITAL_BASED_OUTPATIENT_CLINIC_OR_DEPARTMENT_OTHER): Payer: PRIVATE HEALTH INSURANCE

## 2013-07-16 ENCOUNTER — Telehealth: Payer: Self-pay | Admitting: *Deleted

## 2013-07-16 DIAGNOSIS — D6859 Other primary thrombophilia: Secondary | ICD-10-CM

## 2013-07-16 DIAGNOSIS — I809 Phlebitis and thrombophlebitis of unspecified site: Secondary | ICD-10-CM

## 2013-07-16 LAB — PROTIME-INR
INR: 1.9 — ABNORMAL LOW (ref 2.00–3.50)
Protime: 22.8 Seconds — ABNORMAL HIGH (ref 10.6–13.4)

## 2013-07-16 NOTE — Telephone Encounter (Signed)
Spoke with pt she reports no medication or diet changes. PT/ INR reviewed by Dr. Truett Perna. Order received to continue same dose. Pt voiced understanding. Will recheck lab in one month. Appt confirmed.

## 2013-07-18 ENCOUNTER — Other Ambulatory Visit: Payer: PRIVATE HEALTH INSURANCE | Admitting: Lab

## 2013-08-13 ENCOUNTER — Ambulatory Visit (HOSPITAL_BASED_OUTPATIENT_CLINIC_OR_DEPARTMENT_OTHER): Payer: PRIVATE HEALTH INSURANCE | Admitting: Lab

## 2013-08-13 ENCOUNTER — Telehealth: Payer: Self-pay | Admitting: Oncology

## 2013-08-13 DIAGNOSIS — D6859 Other primary thrombophilia: Secondary | ICD-10-CM

## 2013-08-13 DIAGNOSIS — I809 Phlebitis and thrombophlebitis of unspecified site: Secondary | ICD-10-CM

## 2013-08-13 DIAGNOSIS — D509 Iron deficiency anemia, unspecified: Secondary | ICD-10-CM

## 2013-08-13 LAB — CBC WITH DIFFERENTIAL/PLATELET
BASO%: 0.7 % (ref 0.0–2.0)
Basophils Absolute: 0 10*3/uL (ref 0.0–0.1)
EOS%: 4 % (ref 0.0–7.0)
Eosinophils Absolute: 0.2 10*3/uL (ref 0.0–0.5)
HCT: 36.5 % (ref 34.8–46.6)
HGB: 12.2 g/dL (ref 11.6–15.9)
LYMPH%: 33.4 % (ref 14.0–49.7)
MCH: 28.6 pg (ref 25.1–34.0)
MCHC: 33.4 g/dL (ref 31.5–36.0)
MCV: 85.7 fL (ref 79.5–101.0)
MONO#: 0.4 10*3/uL (ref 0.1–0.9)
MONO%: 6.5 % (ref 0.0–14.0)
NEUT#: 3.3 10*3/uL (ref 1.5–6.5)
NEUT%: 55.4 % (ref 38.4–76.8)
Platelets: 273 10*3/uL (ref 145–400)
RBC: 4.26 10*6/uL (ref 3.70–5.45)
RDW: 14.8 % — ABNORMAL HIGH (ref 11.2–14.5)
WBC: 6 10*3/uL (ref 3.9–10.3)
lymph#: 2 10*3/uL (ref 0.9–3.3)

## 2013-08-13 LAB — PROTIME-INR
INR: 2.9 (ref 2.00–3.50)
Protime: 34.8 Seconds — ABNORMAL HIGH (ref 10.6–13.4)

## 2013-08-13 NOTE — Telephone Encounter (Signed)
Pt called and r/s labs from 9/4 to today @ 3:15

## 2013-08-15 ENCOUNTER — Other Ambulatory Visit: Payer: PRIVATE HEALTH INSURANCE | Admitting: Lab

## 2013-09-19 ENCOUNTER — Telehealth: Payer: Self-pay | Admitting: *Deleted

## 2013-09-19 ENCOUNTER — Other Ambulatory Visit (HOSPITAL_BASED_OUTPATIENT_CLINIC_OR_DEPARTMENT_OTHER): Payer: PRIVATE HEALTH INSURANCE | Admitting: Lab

## 2013-09-19 ENCOUNTER — Encounter (INDEPENDENT_AMBULATORY_CARE_PROVIDER_SITE_OTHER): Payer: Self-pay

## 2013-09-19 DIAGNOSIS — I809 Phlebitis and thrombophlebitis of unspecified site: Secondary | ICD-10-CM

## 2013-09-19 DIAGNOSIS — D6859 Other primary thrombophilia: Secondary | ICD-10-CM

## 2013-09-19 LAB — PROTIME-INR
INR: 2.4 (ref 2.00–3.50)
Protime: 28.8 Seconds — ABNORMAL HIGH (ref 10.6–13.4)

## 2013-09-19 NOTE — Telephone Encounter (Signed)
Left vm stay on same coumadin (take 7.5mg  coumadin daily except 5 mg MWF) will re-check lab 10/16/13 @ 4pm.  Call if questions.

## 2013-10-16 ENCOUNTER — Other Ambulatory Visit: Payer: PRIVATE HEALTH INSURANCE

## 2013-10-17 ENCOUNTER — Ambulatory Visit (HOSPITAL_BASED_OUTPATIENT_CLINIC_OR_DEPARTMENT_OTHER): Payer: PRIVATE HEALTH INSURANCE | Admitting: Lab

## 2013-10-17 DIAGNOSIS — I809 Phlebitis and thrombophlebitis of unspecified site: Secondary | ICD-10-CM

## 2013-10-17 DIAGNOSIS — D6859 Other primary thrombophilia: Secondary | ICD-10-CM

## 2013-10-17 DIAGNOSIS — D509 Iron deficiency anemia, unspecified: Secondary | ICD-10-CM

## 2013-10-17 DIAGNOSIS — Z7901 Long term (current) use of anticoagulants: Secondary | ICD-10-CM

## 2013-10-17 LAB — CBC WITH DIFFERENTIAL/PLATELET
BASO%: 0.8 % (ref 0.0–2.0)
Basophils Absolute: 0 10*3/uL (ref 0.0–0.1)
EOS%: 5.3 % (ref 0.0–7.0)
Eosinophils Absolute: 0.3 10*3/uL (ref 0.0–0.5)
HCT: 38.2 % (ref 34.8–46.6)
HGB: 12.6 g/dL (ref 11.6–15.9)
LYMPH%: 39.7 % (ref 14.0–49.7)
MCH: 29 pg (ref 25.1–34.0)
MCHC: 33 g/dL (ref 31.5–36.0)
MCV: 87.8 fL (ref 79.5–101.0)
MONO#: 0.4 10*3/uL (ref 0.1–0.9)
MONO%: 7.8 % (ref 0.0–14.0)
NEUT#: 2.4 10*3/uL (ref 1.5–6.5)
NEUT%: 46.4 % (ref 38.4–76.8)
Platelets: 295 10*3/uL (ref 145–400)
RBC: 4.35 10*6/uL (ref 3.70–5.45)
RDW: 13.9 % (ref 11.2–14.5)
WBC: 5.2 10*3/uL (ref 3.9–10.3)
lymph#: 2.1 10*3/uL (ref 0.9–3.3)
nRBC: 0 % (ref 0–0)

## 2013-10-17 LAB — PROTIME-INR
INR: 3.2 (ref 2.00–3.50)
Protime: 38.4 Seconds — ABNORMAL HIGH (ref 10.6–13.4)

## 2013-10-18 ENCOUNTER — Telehealth: Payer: Self-pay | Admitting: *Deleted

## 2013-10-18 NOTE — Telephone Encounter (Signed)
PT INR reviewed by Dr. Truett Perna. Order received to have pt continue same dose, recheck in one month. Left message on voicemail for pt.

## 2013-11-12 ENCOUNTER — Telehealth: Payer: Self-pay | Admitting: Oncology

## 2013-11-12 ENCOUNTER — Other Ambulatory Visit (HOSPITAL_BASED_OUTPATIENT_CLINIC_OR_DEPARTMENT_OTHER): Payer: PRIVATE HEALTH INSURANCE | Admitting: Lab

## 2013-11-12 ENCOUNTER — Ambulatory Visit (HOSPITAL_BASED_OUTPATIENT_CLINIC_OR_DEPARTMENT_OTHER): Payer: PRIVATE HEALTH INSURANCE | Admitting: Oncology

## 2013-11-12 VITALS — BP 158/84 | HR 81 | Temp 97.9°F | Resp 20 | Ht 67.0 in | Wt 218.5 lb

## 2013-11-12 DIAGNOSIS — I809 Phlebitis and thrombophlebitis of unspecified site: Secondary | ICD-10-CM

## 2013-11-12 DIAGNOSIS — M79609 Pain in unspecified limb: Secondary | ICD-10-CM

## 2013-11-12 DIAGNOSIS — Z86718 Personal history of other venous thrombosis and embolism: Secondary | ICD-10-CM

## 2013-11-12 DIAGNOSIS — Z7901 Long term (current) use of anticoagulants: Secondary | ICD-10-CM

## 2013-11-12 DIAGNOSIS — D509 Iron deficiency anemia, unspecified: Secondary | ICD-10-CM

## 2013-11-12 DIAGNOSIS — D6859 Other primary thrombophilia: Secondary | ICD-10-CM

## 2013-11-12 DIAGNOSIS — Z862 Personal history of diseases of the blood and blood-forming organs and certain disorders involving the immune mechanism: Secondary | ICD-10-CM

## 2013-11-12 LAB — PROTIME-INR
INR: 2.6 (ref 2.00–3.50)
Protime: 31.2 Seconds — ABNORMAL HIGH (ref 10.6–13.4)

## 2013-11-12 NOTE — Telephone Encounter (Signed)
Gave pt appt for labs and Md for December, January and September 2015

## 2013-11-12 NOTE — Progress Notes (Signed)
   [facility]    OFFICE PROGRESS NOTE   INTERVAL HISTORY:   She returns for scheduled visit. She continues Coumadin. No symptom of recurrent thrombosis. She did not have a menstrual cycle last month and the menstrual flow has diminished. No complaint.  Objective:  Vital signs in last 24 hours:  Blood pressure 158/84, pulse 81, temperature 97.9 F (36.6 C), temperature source Oral, resp. rate 20, height 5\' 7"  (1.702 m), weight 218 lb 8 oz (99.111 kg).    Resp: Lungs clear bilaterally Cardio: Regular rate and rhythm GI: No hepatosplenomegaly Vascular: The left lower leg is slightly larger than the right side. No edema or erythema   Lab Results:  Lab Results  Component Value Date   WBC 5.2 10/17/2013   HGB 12.6 10/17/2013   HCT 38.2 10/17/2013   MCV 87.8 10/17/2013   PLT 295 10/17/2013   11/12/2013-PT/INR 2.6   Medications: I have reviewed the patient's current medications.  Assessment/Plan: 1. 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 The PT is therapeutic. 2. 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 Repeat lupus and anticardiolipin antibody testing was negative until there was a positive lupus anticoagulant panel on 05/27/2008. There was a positive lupus anticoagulant and hexagonal phase study on 10/27/2009. She would like to continue Coumadin anticoagulation.  3. Anemia secondary to menorrhagia and iron deficiency. The hemoglobin has normalized on iron therapy. 4. Chronic bilateral leg pain.  5.    Disposition:  She appears stable. We discussed repeat antiphospholipid testing with the potential of discontinuing Coumadin. She would like to continue Coumadin for now. She will return for an office visit in 9 months. She will return for a monthly PT check.  The hemoglobin has corrected with iron and a decrease in menstrual blood loss.   Thornton Papas,  MD  11/12/2013  5:04 PM

## 2013-12-10 ENCOUNTER — Other Ambulatory Visit: Payer: PRIVATE HEALTH INSURANCE

## 2013-12-10 ENCOUNTER — Telehealth: Payer: Self-pay | Admitting: Oncology

## 2013-12-10 NOTE — Telephone Encounter (Signed)
Pt called to r/s 12/30 lb to 1/5. Pt has new d/t.

## 2013-12-13 ENCOUNTER — Other Ambulatory Visit: Payer: PRIVATE HEALTH INSURANCE

## 2013-12-16 ENCOUNTER — Other Ambulatory Visit (HOSPITAL_BASED_OUTPATIENT_CLINIC_OR_DEPARTMENT_OTHER): Payer: No Typology Code available for payment source

## 2013-12-16 DIAGNOSIS — D6859 Other primary thrombophilia: Secondary | ICD-10-CM

## 2013-12-16 DIAGNOSIS — I809 Phlebitis and thrombophlebitis of unspecified site: Secondary | ICD-10-CM

## 2013-12-16 LAB — PROTIME-INR
INR: 2.6 (ref 2.00–3.50)
Protime: 31.2 Seconds — ABNORMAL HIGH (ref 10.6–13.4)

## 2013-12-17 ENCOUNTER — Telehealth: Payer: Self-pay | Admitting: *Deleted

## 2013-12-17 NOTE — Telephone Encounter (Signed)
Called and informed patient to continue same dose of coumadin (7.5mg  every day except MWF 5mg ) and to repeat PT with INR in one month.  Per Dr. Benay Spice.  Patient verbalized understanding.

## 2013-12-18 ENCOUNTER — Telehealth: Payer: Self-pay | Admitting: Oncology

## 2013-12-18 NOTE — Telephone Encounter (Signed)
S/w the pt's sister regarding the lab appt in feb 2015.

## 2013-12-30 ENCOUNTER — Other Ambulatory Visit: Payer: Self-pay | Admitting: Oncology

## 2013-12-30 DIAGNOSIS — I809 Phlebitis and thrombophlebitis of unspecified site: Secondary | ICD-10-CM

## 2014-01-16 ENCOUNTER — Other Ambulatory Visit: Payer: No Typology Code available for payment source

## 2014-01-16 ENCOUNTER — Telehealth: Payer: Self-pay | Admitting: Oncology

## 2014-01-16 ENCOUNTER — Other Ambulatory Visit: Payer: Self-pay | Admitting: *Deleted

## 2014-01-16 DIAGNOSIS — I809 Phlebitis and thrombophlebitis of unspecified site: Secondary | ICD-10-CM

## 2014-01-16 NOTE — Telephone Encounter (Signed)
S/w the pt and she is aware of her lab appt. °

## 2014-01-16 NOTE — Progress Notes (Signed)
[facility] for lab appointment today. POF to scheduler.

## 2014-01-20 ENCOUNTER — Other Ambulatory Visit: Payer: No Typology Code available for payment source

## 2014-01-23 ENCOUNTER — Other Ambulatory Visit: Payer: No Typology Code available for payment source

## 2014-01-23 ENCOUNTER — Telehealth: Payer: Self-pay | Admitting: Oncology

## 2014-01-23 NOTE — Telephone Encounter (Signed)
Pt called and r/s lab to tomorrow

## 2014-01-24 ENCOUNTER — Telehealth: Payer: Self-pay | Admitting: Oncology

## 2014-01-24 ENCOUNTER — Telehealth: Payer: Self-pay | Admitting: *Deleted

## 2014-01-24 ENCOUNTER — Other Ambulatory Visit: Payer: No Typology Code available for payment source

## 2014-01-24 ENCOUNTER — Ambulatory Visit (HOSPITAL_BASED_OUTPATIENT_CLINIC_OR_DEPARTMENT_OTHER): Payer: No Typology Code available for payment source

## 2014-01-24 ENCOUNTER — Encounter (INDEPENDENT_AMBULATORY_CARE_PROVIDER_SITE_OTHER): Payer: Self-pay

## 2014-01-24 DIAGNOSIS — D509 Iron deficiency anemia, unspecified: Secondary | ICD-10-CM

## 2014-01-24 DIAGNOSIS — I809 Phlebitis and thrombophlebitis of unspecified site: Secondary | ICD-10-CM

## 2014-01-24 DIAGNOSIS — D6859 Other primary thrombophilia: Secondary | ICD-10-CM

## 2014-01-24 LAB — CBC WITH DIFFERENTIAL/PLATELET
BASO%: 0.8 % (ref 0.0–2.0)
Basophils Absolute: 0.1 10*3/uL (ref 0.0–0.1)
EOS%: 3.3 % (ref 0.0–7.0)
Eosinophils Absolute: 0.2 10*3/uL (ref 0.0–0.5)
HCT: 39.4 % (ref 34.8–46.6)
HGB: 13.1 g/dL (ref 11.6–15.9)
LYMPH%: 31.9 % (ref 14.0–49.7)
MCH: 30.2 pg (ref 25.1–34.0)
MCHC: 33.3 g/dL (ref 31.5–36.0)
MCV: 90.7 fL (ref 79.5–101.0)
MONO#: 0.5 10*3/uL (ref 0.1–0.9)
MONO%: 6.8 % (ref 0.0–14.0)
NEUT#: 3.8 10*3/uL (ref 1.5–6.5)
NEUT%: 57.2 % (ref 38.4–76.8)
Platelets: 289 10*3/uL (ref 145–400)
RBC: 4.35 10*6/uL (ref 3.70–5.45)
RDW: 13.1 % (ref 11.2–14.5)
WBC: 6.7 10*3/uL (ref 3.9–10.3)
lymph#: 2.1 10*3/uL (ref 0.9–3.3)

## 2014-01-24 LAB — PROTIME-INR
INR: 3.5 (ref 2.00–3.50)
Protime: 42 Seconds — ABNORMAL HIGH (ref 10.6–13.4)

## 2014-01-24 NOTE — Telephone Encounter (Signed)
Left message on voicemail requesting pt call office with any medication or dietary changes. INR is higher than her usual range. Recheck INR 1-2 weeks per Dr. Benay Spice. Order sent to schedulers.

## 2014-01-24 NOTE — Telephone Encounter (Signed)
Called pt and left message regarding labs, mailed appt to pt's house

## 2014-01-27 ENCOUNTER — Telehealth: Payer: Self-pay | Admitting: *Deleted

## 2014-01-27 NOTE — Telephone Encounter (Signed)
Left VM that she was returning our call regarding her medications.

## 2014-01-27 NOTE — Telephone Encounter (Signed)
Called back and left VM asking if she has had any diet or med changes since her INR was higher than her usual range? Repeat lab in 1-2 weeks. Schedule was mailed.

## 2014-02-03 ENCOUNTER — Telehealth: Payer: Self-pay | Admitting: Oncology

## 2014-02-03 NOTE — Telephone Encounter (Signed)
pt called to r/s lab to her day off work...done

## 2014-02-06 ENCOUNTER — Other Ambulatory Visit: Payer: No Typology Code available for payment source

## 2014-02-06 ENCOUNTER — Telehealth: Payer: Self-pay | Admitting: Oncology

## 2014-02-06 NOTE — Telephone Encounter (Signed)
pt called and r/ s lab to 3/2

## 2014-02-07 ENCOUNTER — Other Ambulatory Visit: Payer: No Typology Code available for payment source

## 2014-02-10 ENCOUNTER — Other Ambulatory Visit (HOSPITAL_BASED_OUTPATIENT_CLINIC_OR_DEPARTMENT_OTHER): Payer: No Typology Code available for payment source

## 2014-02-10 DIAGNOSIS — D6859 Other primary thrombophilia: Secondary | ICD-10-CM

## 2014-02-10 DIAGNOSIS — I809 Phlebitis and thrombophlebitis of unspecified site: Secondary | ICD-10-CM

## 2014-02-10 DIAGNOSIS — Z7901 Long term (current) use of anticoagulants: Secondary | ICD-10-CM

## 2014-02-10 LAB — PROTIME-INR
INR: 2.3 (ref 2.00–3.50)
Protime: 27.6 Seconds — ABNORMAL HIGH (ref 10.6–13.4)

## 2014-02-11 ENCOUNTER — Telehealth: Payer: Self-pay | Admitting: *Deleted

## 2014-02-11 DIAGNOSIS — I809 Phlebitis and thrombophlebitis of unspecified site: Secondary | ICD-10-CM

## 2014-02-11 NOTE — Telephone Encounter (Signed)
Message copied by Brien Few on Tue Feb 11, 2014 12:19 PM ------      Message from: Betsy Coder B      Created: Mon Feb 10, 2014  8:17 PM       Please call patient, same coumadin, check PT in 75mo. ------

## 2014-02-11 NOTE — Telephone Encounter (Signed)
Left message on voicemail instructing pt to continue same dose of Coumadin. Orders entered for one month lab appt.

## 2014-02-12 ENCOUNTER — Telehealth: Payer: Self-pay | Admitting: Oncology

## 2014-02-12 NOTE — Telephone Encounter (Signed)
s.w. pt husband and advised on lab added on for 3.31...ok and aware

## 2014-02-20 ENCOUNTER — Telehealth: Payer: Self-pay | Admitting: *Deleted

## 2014-02-20 NOTE — Telephone Encounter (Signed)
Has question about having tooth pulled in regards to coumadin. Attempted to return her call-left VM requesting date of extraction. Patient has H/O recurrent DVT.

## 2014-03-10 ENCOUNTER — Other Ambulatory Visit (HOSPITAL_BASED_OUTPATIENT_CLINIC_OR_DEPARTMENT_OTHER): Payer: No Typology Code available for payment source

## 2014-03-10 ENCOUNTER — Telehealth: Payer: Self-pay | Admitting: Medical Oncology

## 2014-03-10 DIAGNOSIS — Z7901 Long term (current) use of anticoagulants: Secondary | ICD-10-CM

## 2014-03-10 DIAGNOSIS — D6859 Other primary thrombophilia: Secondary | ICD-10-CM

## 2014-03-10 DIAGNOSIS — I809 Phlebitis and thrombophlebitis of unspecified site: Secondary | ICD-10-CM

## 2014-03-10 LAB — PROTIME-INR
INR: 2.5 (ref 2.00–3.50)
Protime: 30 Seconds — ABNORMAL HIGH (ref 10.6–13.4)

## 2014-03-10 NOTE — Telephone Encounter (Signed)
LVMOM. Per MD, informed patient of PT/INR results and for patient to continue on current dosage as well as to f/u in one month sched appt for labs (04/11/14). Requested for patient to call office back to notify us of receipt of message.

## 2014-03-11 ENCOUNTER — Other Ambulatory Visit: Payer: No Typology Code available for payment source

## 2014-04-11 ENCOUNTER — Other Ambulatory Visit: Payer: PRIVATE HEALTH INSURANCE

## 2014-04-11 ENCOUNTER — Telehealth: Payer: Self-pay | Admitting: Medical Oncology

## 2014-04-11 ENCOUNTER — Other Ambulatory Visit (HOSPITAL_BASED_OUTPATIENT_CLINIC_OR_DEPARTMENT_OTHER): Payer: No Typology Code available for payment source

## 2014-04-11 DIAGNOSIS — I82409 Acute embolism and thrombosis of unspecified deep veins of unspecified lower extremity: Secondary | ICD-10-CM

## 2014-04-11 DIAGNOSIS — I809 Phlebitis and thrombophlebitis of unspecified site: Secondary | ICD-10-CM

## 2014-04-11 LAB — PROTIME-INR
INR: 1.8 — ABNORMAL LOW (ref 2.00–3.50)
Protime: 21.6 Seconds — ABNORMAL HIGH (ref 10.6–13.4)

## 2014-04-11 NOTE — Telephone Encounter (Signed)
Per Dr Benay Spice -pt to stay on same coumadin dose and repeat PT/INR in  1 month.  [name] [name]  confirmed she  is taking  Coumadin 5 mg M-W-F and 7.5 other days and understands to stay on same dose and lab appt given for 1 month. Lab order entered .

## 2014-04-11 NOTE — Telephone Encounter (Signed)
Left message re:  what is her coumadin dose?

## 2014-05-09 ENCOUNTER — Other Ambulatory Visit (HOSPITAL_BASED_OUTPATIENT_CLINIC_OR_DEPARTMENT_OTHER): Payer: No Typology Code available for payment source

## 2014-05-09 ENCOUNTER — Telehealth: Payer: Self-pay | Admitting: *Deleted

## 2014-05-09 DIAGNOSIS — I82409 Acute embolism and thrombosis of unspecified deep veins of unspecified lower extremity: Secondary | ICD-10-CM

## 2014-05-09 DIAGNOSIS — D6859 Other primary thrombophilia: Secondary | ICD-10-CM

## 2014-05-09 DIAGNOSIS — I809 Phlebitis and thrombophlebitis of unspecified site: Secondary | ICD-10-CM

## 2014-05-09 LAB — PROTIME-INR
INR: 2.3 (ref 2.00–3.50)
Protime: 27.6 Seconds — ABNORMAL HIGH (ref 10.6–13.4)

## 2014-05-09 NOTE — Telephone Encounter (Signed)
Message copied by Brien Few on Fri May 09, 2014  1:21 PM ------      Message from: Ladell Pier      Created: Fri May 09, 2014  1:13 PM       Please call patient, same coumadin, check PT 15mo. ------

## 2014-05-09 NOTE — Telephone Encounter (Signed)
Called pt with instructions to continue same dose of Coumadin, recheck in one month. She confirms she is taking Coumadin 5 mg MWF, 7.5 mg all other days. Understands to expect call from schedulers.

## 2014-05-13 ENCOUNTER — Telehealth: Payer: Self-pay | Admitting: Oncology

## 2014-05-13 NOTE — Telephone Encounter (Signed)
lmonvm advising the pt of her lab appt on 06/09/2014@2 :45pm

## 2014-06-06 ENCOUNTER — Other Ambulatory Visit (HOSPITAL_BASED_OUTPATIENT_CLINIC_OR_DEPARTMENT_OTHER): Payer: No Typology Code available for payment source

## 2014-06-06 DIAGNOSIS — I809 Phlebitis and thrombophlebitis of unspecified site: Secondary | ICD-10-CM

## 2014-06-06 LAB — PROTIME-INR
INR: 2.4 (ref 2.00–3.50)
Protime: 28.8 Seconds — ABNORMAL HIGH (ref 10.6–13.4)

## 2014-06-09 ENCOUNTER — Other Ambulatory Visit: Payer: No Typology Code available for payment source

## 2014-06-10 ENCOUNTER — Telehealth: Payer: Self-pay | Admitting: Nurse Practitioner

## 2014-06-10 NOTE — Telephone Encounter (Signed)
Message copied by Alla Feeling on Tue Jun 10, 2014  4:00 PM ------      Message from: Tania Ade      Created: Tue Jun 10, 2014  3:56 PM                   ----- Message -----         From: Ladell Pier, MD         Sent: 06/06/2014   5:33 PM           To: Tania Ade, RN, Ludwig Lean, RN, #            Please call patient, same coumadin , check PT 80mo. ------

## 2014-06-10 NOTE — Telephone Encounter (Signed)
Per Dr. Benay Spice, spoke to family member - daughter Ellorie Kindall -  to inform pt continue same dose coumadin and keep appointment to check labs in 1 month. Family member encouraged to have pt call with any questions or concerns. She verbalizes understanding of the above.

## 2014-06-11 ENCOUNTER — Other Ambulatory Visit: Payer: Self-pay | Admitting: *Deleted

## 2014-06-11 DIAGNOSIS — I809 Phlebitis and thrombophlebitis of unspecified site: Secondary | ICD-10-CM

## 2014-06-13 ENCOUNTER — Telehealth: Payer: Self-pay | Admitting: Oncology

## 2014-06-13 NOTE — Telephone Encounter (Signed)
S/w the pt's husband and he is aware of the lab appt on 07/09/2014

## 2014-07-08 ENCOUNTER — Telehealth: Payer: Self-pay | Admitting: Oncology

## 2014-07-08 NOTE — Telephone Encounter (Signed)
Pt r/s due to she could not get off work.Marland Kitchen..KJ

## 2014-07-09 ENCOUNTER — Other Ambulatory Visit: Payer: No Typology Code available for payment source

## 2014-07-10 ENCOUNTER — Other Ambulatory Visit (HOSPITAL_BASED_OUTPATIENT_CLINIC_OR_DEPARTMENT_OTHER): Payer: No Typology Code available for payment source

## 2014-07-10 DIAGNOSIS — I809 Phlebitis and thrombophlebitis of unspecified site: Secondary | ICD-10-CM

## 2014-07-10 DIAGNOSIS — D6859 Other primary thrombophilia: Secondary | ICD-10-CM

## 2014-07-10 LAB — PROTIME-INR
INR: 2.5 (ref 2.00–3.50)
Protime: 30 Seconds — ABNORMAL HIGH (ref 10.6–13.4)

## 2014-07-11 ENCOUNTER — Telehealth: Payer: Self-pay | Admitting: *Deleted

## 2014-07-11 NOTE — Telephone Encounter (Signed)
Instructed to stay on same coumadin. F/U on 08/12/14 as scheduled.

## 2014-08-12 ENCOUNTER — Telehealth: Payer: Self-pay | Admitting: Oncology

## 2014-08-12 ENCOUNTER — Ambulatory Visit (HOSPITAL_BASED_OUTPATIENT_CLINIC_OR_DEPARTMENT_OTHER): Payer: No Typology Code available for payment source | Admitting: Oncology

## 2014-08-12 ENCOUNTER — Other Ambulatory Visit (HOSPITAL_BASED_OUTPATIENT_CLINIC_OR_DEPARTMENT_OTHER): Payer: No Typology Code available for payment source

## 2014-08-12 VITALS — BP 146/76 | HR 65 | Temp 97.7°F | Resp 20 | Ht 67.0 in | Wt 216.7 lb

## 2014-08-12 DIAGNOSIS — I809 Phlebitis and thrombophlebitis of unspecified site: Secondary | ICD-10-CM

## 2014-08-12 DIAGNOSIS — D6859 Other primary thrombophilia: Secondary | ICD-10-CM

## 2014-08-12 LAB — PROTIME-INR
INR: 2.6 (ref 2.00–3.50)
Protime: 31.2 Seconds — ABNORMAL HIGH (ref 10.6–13.4)

## 2014-08-12 NOTE — Progress Notes (Signed)
  [place] OFFICE PROGRESS NOTE   Diagnosis: Venous thrombosis  INTERVAL HISTORY:   She returns as scheduled. She continues Coumadin anticoagulation. No bleeding. No sign of recurrent thrombosis. She is now amenorrheic and has hot flashes. She is no longer taking iron.  Objective:  Vital signs in last 24 hours:  Blood pressure 146/76, pulse 65, temperature 97.7 F (36.5 C), temperature source Oral, resp. rate 20, height 5\' 7"  (1.702 m), weight 216 lb 11.2 oz (98.294 kg), SpO2 99.00%.   Resp: Lungs clear bilaterally Cardio: Regular rate and rhythm GI: No hepatosplenomegaly Vascular: No leg edema Musculoskeletal: Valgus deformity at the right greater than left knee      Lab Results:  Lab Results  Component Value Date   WBC 6.7 01/24/2014   HGB 13.1 01/24/2014   HCT 39.4 01/24/2014   MCV 90.7 01/24/2014   PLT 289 01/24/2014   NEUTROABS 3.8 01/24/2014    PT/INR 2.6   Medications: I have reviewed the patient's current medications.  Assessment/Plan: 1. 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 The PT is therapeutic. 2. 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 Repeat lupus and anticardiolipin antibody testing was negative until there was a positive lupus anticoagulant panel on 05/27/2008. There was a positive lupus anticoagulant and hexagonal phase study on 10/27/2009. She would like to continue Coumadin anticoagulation.  3. Anemia secondary to menorrhagia and iron deficiency. The hemoglobin was normal in February of 2015 and she is now amenorrheic 4. Chronic bilateral leg pain.    Disposition:  [name] [name] appears stable. She will continue Coumadin anticoagulation. We discussed the indication for repeat antiphospholipid testing. She is comfortable continuing Coumadin for now. She will contact us for symptoms of thrombosis or bleeding. [name] [name] will return for a  PT/INR in one month. She is scheduled for office visit in one year.  Betsy Coder, MD  08/12/2014  3:37 PM

## 2014-08-12 NOTE — Telephone Encounter (Signed)
, °

## 2014-09-09 ENCOUNTER — Telehealth: Payer: Self-pay | Admitting: *Deleted

## 2014-09-09 ENCOUNTER — Other Ambulatory Visit (HOSPITAL_BASED_OUTPATIENT_CLINIC_OR_DEPARTMENT_OTHER): Payer: No Typology Code available for payment source

## 2014-09-09 DIAGNOSIS — D6859 Other primary thrombophilia: Secondary | ICD-10-CM

## 2014-09-09 DIAGNOSIS — I809 Phlebitis and thrombophlebitis of unspecified site: Secondary | ICD-10-CM

## 2014-09-09 LAB — PROTIME-INR
INR: 3.2 (ref 2.00–3.50)
Protime: 38.4 Seconds — ABNORMAL HIGH (ref 10.6–13.4)

## 2014-09-09 NOTE — Telephone Encounter (Signed)
Left message on voicemail instructing pt to continue same dose of Coumadin.  

## 2014-09-09 NOTE — Telephone Encounter (Signed)
Message copied by Brien Few on Tue Sep 09, 2014  5:13 PM ------      Message from: Ladell Pier      Created: Tue Sep 09, 2014  2:05 PM       Please call patient, same coumadin, check PT 1 mo. ------

## 2014-09-24 ENCOUNTER — Other Ambulatory Visit: Payer: Self-pay | Admitting: Oncology

## 2014-09-24 DIAGNOSIS — I809 Phlebitis and thrombophlebitis of unspecified site: Secondary | ICD-10-CM

## 2014-10-06 ENCOUNTER — Telehealth: Payer: Self-pay | Admitting: Oncology

## 2014-10-06 ENCOUNTER — Other Ambulatory Visit (HOSPITAL_BASED_OUTPATIENT_CLINIC_OR_DEPARTMENT_OTHER): Payer: No Typology Code available for payment source

## 2014-10-06 DIAGNOSIS — I809 Phlebitis and thrombophlebitis of unspecified site: Secondary | ICD-10-CM

## 2014-10-06 DIAGNOSIS — D6852 Prothrombin gene mutation: Secondary | ICD-10-CM

## 2014-10-06 LAB — PROTIME-INR
INR: 2.4 (ref 2.00–3.50)
Protime: 28.8 Seconds — ABNORMAL HIGH (ref 10.6–13.4)

## 2014-10-06 NOTE — Telephone Encounter (Signed)
Pt called to r/s labs for today due to work conflict .Marland Kitchen... KJ

## 2014-10-07 ENCOUNTER — Telehealth: Payer: Self-pay | Admitting: *Deleted

## 2014-10-07 ENCOUNTER — Other Ambulatory Visit: Payer: No Typology Code available for payment source

## 2014-10-07 DIAGNOSIS — I809 Phlebitis and thrombophlebitis of unspecified site: Secondary | ICD-10-CM

## 2014-10-07 NOTE — Telephone Encounter (Signed)
Message copied by Brien Few on Tue Oct 07, 2014  9:38 AM ------      Message from: [name] [name]      Created: Mon Oct 06, 2014  1:37 PM       Please call patient, same coumadin , check pt 4 weeks ------

## 2014-10-07 NOTE — Telephone Encounter (Signed)
Spoke with pt's husband. Instructed him to have pt continue same dose of Coumadin. Recheck lab in one month as scheduled. He will notify pt.

## 2014-10-30 ENCOUNTER — Telehealth: Payer: Self-pay | Admitting: *Deleted

## 2014-10-30 NOTE — Telephone Encounter (Signed)
Pt called states "I'm getting my ingrown toenail taken off tomorrow on my right big toe; is it OK to stay on my Coumadin?"  Pt states that "[facility]" (can't recall MD name) "knows I take Coumadin and said it was Okay but I wanted to check with Dr. Benay Spice" Per Dr. Benay Spice; informed pt it was up to MD doing procedure; if they said Okay; Dr. Benay Spice is okay with that.  Pt verbalized understanding and expressed appreciation for information.

## 2014-10-31 ENCOUNTER — Encounter: Payer: Self-pay | Admitting: Podiatry

## 2014-10-31 ENCOUNTER — Ambulatory Visit (INDEPENDENT_AMBULATORY_CARE_PROVIDER_SITE_OTHER): Payer: No Typology Code available for payment source | Admitting: Podiatry

## 2014-10-31 VITALS — BP 179/89 | HR 84 | Resp 13 | Ht 67.0 in | Wt 200.0 lb

## 2014-10-31 DIAGNOSIS — L03039 Cellulitis of unspecified toe: Secondary | ICD-10-CM

## 2014-10-31 DIAGNOSIS — IMO0002 Reserved for concepts with insufficient information to code with codable children: Secondary | ICD-10-CM | POA: Insufficient documentation

## 2014-10-31 MED ORDER — CEPHALEXIN 500 MG PO CAPS: 500.0000 mg | ORAL_CAPSULE | Freq: Three times a day (TID) | ORAL | Status: DC

## 2014-10-31 NOTE — Progress Notes (Signed)
Patient ID: [name] [name], female   DOB: 02-Oct-1961, 53 y.o.   MRN: [id number]  Subjective: 53 year old female presents to the office today for an infected, painful ingrown toenail on the right big toe (lateral aspect). She states this has been ongoing for about one month and has been progressive. She has been applying antibiotic ointment and a band-aid to the area without resolution. She has not been on any antibiotics. She is on Coumadin for blood clots in the left leg, but unsure of her INR. She denies any systemic complaints as fevers, chills, nausea, vomiting. No other complaints at this time.  Review of systems: Systems reviewed and are otherwise negative except stated above.  Objective: AAO x3, NAD DP/PT pulses palpable bilaterally, CRT less than 3 seconds Protective sensation intact with Simms Weinstein monofilament, vibratory sensation intact, Achilles tendon reflex intact On the lateral aspect of the right hallux nail there is significant ingrowing present with erythema to the distal aspect of the right hallux from the level middle aspect the proximal phalanx distally. There is no evidence of ascending saline his. No fluctuance or crepitus. There is purulence identified in the lateral nail border. Tenderness palpation along the lateral nail border. The underlying nail is loose from the nailbed. Remaining nails without pathology. No pinpoint bony tenderness or pain with vibratory sensation MMT 5/5, ROM WNL No calf pain with compression, swelling, warmth, erythema  Assessment: 53 year old female with right lateral hallux paronychia, cellulitis to the digit.  Plan: -Conservative versus surgical treatment discussed including alternatives, risks, complications. -At this time recommended a partial nail avulsion due to the infection. Also discussed the patient risks, occasions the procedure for which they understand and verbally consented the procedure. Also discussed possible risk of  bleeding due to her being on Coumadin and unsure what her INR is. However given the infection proceed with the procedure as it is low risk. Under sterile conditions a total of 2.5 mL of a one-to-one mixture of 2% lidocaine plain and 0.5% Marcaine plain was infiltrated in a hallux block fashion. The skin was then prepped in sterile fashion. A tourniquet was then applied. Next the lateral border of the right hallux nail was sharply excised and injured to remove the entire offending nail border. At this time it was noted at the entire nail was loosened the underlying nail bed and recommended nail avulsion and discuss this with the patient for which she understands. She agrees to the total nail been removed. Next the remainder the nail was then excised and she'll remove the entire offending nail borders. Once the nail was removed no further purulence was identified in the skin underlying was intact. The area was then debrided and copiously irrigated. Silvadene was applied followed by dry sterile dressing. After application a dressing the tourniquet was removed and there is noted to be an immediate capillary refill time to the digit. Patient tolerated the procedure well without any complications. At the conclusion of the procedure the patient sat for approximately 15 minutes to ensure that there is no postoperative bleeding for which there was not. -Post procedure instructions were discussed with the patient for which she verbally understood and she was also provided with written instructions. -Prescribed Keflex. Monitoring conical signs or symptoms of infection and directed to call the office immediately if any are to occur or go to the emergency room. -Follow-up in one week. In the meantime, call the office with any questions, concerns, change in symptoms.

## 2014-10-31 NOTE — Progress Notes (Deleted)
   Subjective:    Patient ID: [name] [name], female    DOB: July 03, 1961, 53 y.o.   MRN: [id number]  HPI    Review of Systems  All other systems reviewed and are negative.      Objective:   Physical Exam        Assessment & Plan:

## 2014-10-31 NOTE — Patient Instructions (Signed)

## 2014-11-03 ENCOUNTER — Telehealth: Payer: Self-pay | Admitting: Oncology

## 2014-11-03 NOTE — Telephone Encounter (Signed)
Pt called to r/s labs 11/24 to 12/02 pt has to work, pt confirmed D/T .Marland Kitchen.. KJ

## 2014-11-04 ENCOUNTER — Other Ambulatory Visit: Payer: No Typology Code available for payment source

## 2014-11-05 ENCOUNTER — Ambulatory Visit: Payer: Self-pay | Admitting: Podiatry

## 2014-11-05 ENCOUNTER — Ambulatory Visit: Payer: No Typology Code available for payment source | Admitting: Podiatry

## 2014-11-12 ENCOUNTER — Ambulatory Visit (INDEPENDENT_AMBULATORY_CARE_PROVIDER_SITE_OTHER): Payer: No Typology Code available for payment source | Admitting: Podiatry

## 2014-11-12 ENCOUNTER — Other Ambulatory Visit (HOSPITAL_BASED_OUTPATIENT_CLINIC_OR_DEPARTMENT_OTHER): Payer: No Typology Code available for payment source

## 2014-11-12 ENCOUNTER — Telehealth: Payer: Self-pay | Admitting: *Deleted

## 2014-11-12 ENCOUNTER — Encounter: Payer: Self-pay | Admitting: Podiatry

## 2014-11-12 ENCOUNTER — Telehealth: Payer: Self-pay | Admitting: Oncology

## 2014-11-12 VITALS — BP 120/71 | HR 63 | Resp 18

## 2014-11-12 DIAGNOSIS — D6852 Prothrombin gene mutation: Secondary | ICD-10-CM

## 2014-11-12 DIAGNOSIS — L03019 Cellulitis of unspecified finger: Secondary | ICD-10-CM

## 2014-11-12 DIAGNOSIS — I809 Phlebitis and thrombophlebitis of unspecified site: Secondary | ICD-10-CM

## 2014-11-12 DIAGNOSIS — IMO0002 Reserved for concepts with insufficient information to code with codable children: Secondary | ICD-10-CM

## 2014-11-12 LAB — PROTIME-INR
INR: 1.9 — ABNORMAL LOW (ref 2.00–3.50)
Protime: 22.8 Seconds — ABNORMAL HIGH (ref 10.6–13.4)

## 2014-11-12 NOTE — Telephone Encounter (Signed)
cld pt & left message of appt for 12/30 per pof for lab only appt-adv per pof 12/22 7 1/19 CX per Provider

## 2014-11-12 NOTE — Telephone Encounter (Signed)
-----   Message from Ladell Pier, MD sent at 11/12/2014  3:37 PM EST ----- Please call patient, same coumadin, check PT 1 month

## 2014-11-12 NOTE — Progress Notes (Signed)
Patient ID: [name] [name], female   DOB: 04/21/1961, 53 y.o.   MRN: [id number]  Subjective: 53 year old female returns the office today with her husband for follow up evaluation status post right lateral hallux partial nail avulsion secondary to paronychia. She states that she has stopped soaking the area the area and covering with antibiotic ointment and a Band-Aid as it is healed. She denies any purulence or drainage from around the area. She reports decreased erythema and denies any streaking. Denies any systemic complaints as fevers, chills, nausea, vomiting. No other complaints at this time.  Objective: AAO x3, NAD DP/PT pulses palpable bilaterally, CRT less than 3 seconds Protective sensation intact with Simms Weinstein monofilament, vibratory sensation intact, Achilles tendon reflex intact Status post partial nail avulsion lateral border of the right hallux which is healing appropriately at this time point. There is a lot of hyperkeratotic tissue within the procedure site. There is no open lesion identified in the area has healed over. There is no swelling erythema or ascending cellulitis. No areas of fluctuance or crepitus. No malodor. No drainage or purulence. No tenderness of the procedure site. No pain with calf compression, swelling, warmth, erythema.  Assessment: 53 year old female status post partial nail avulsion right lateral hallux due to paronychia which is healed  Plan: -Treatment options were discussed including alternatives, risks, complications. -Hyperkeratotic tissue was sharply debrided without complications. -At this time the area has healed. Can continue to cover the Band-Aid as needed. -Monitor for any reoccurrence or any change or increase in symptoms. Follow-up as needed. In the meantime, call the office in the questions, concerns, change in symptoms.

## 2014-11-12 NOTE — Telephone Encounter (Signed)
Called pt with instructions to continue same dose of Coumadin. Order sent to schedulers for lab in one month.

## 2014-11-21 ENCOUNTER — Telehealth: Payer: Self-pay | Admitting: *Deleted

## 2014-11-21 NOTE — Telephone Encounter (Signed)
Pharmacy needs OK from MD they are changing her brand of warfarin due to supplier at [facility].  Per Dr. Benay Spice, [place] to make change but need to check her INR 2 weeks after the change. Made pharmacy aware (will add to label)-next PT is 12/10/14-keep this appointment. Also called Kareena at work # and made her aware.

## 2014-12-02 ENCOUNTER — Other Ambulatory Visit: Payer: No Typology Code available for payment source

## 2014-12-10 ENCOUNTER — Other Ambulatory Visit: Payer: No Typology Code available for payment source

## 2014-12-11 ENCOUNTER — Other Ambulatory Visit (HOSPITAL_BASED_OUTPATIENT_CLINIC_OR_DEPARTMENT_OTHER): Payer: No Typology Code available for payment source

## 2014-12-11 ENCOUNTER — Telehealth: Payer: Self-pay | Admitting: *Deleted

## 2014-12-11 ENCOUNTER — Telehealth: Payer: Self-pay | Admitting: Oncology

## 2014-12-11 DIAGNOSIS — D6852 Prothrombin gene mutation: Secondary | ICD-10-CM

## 2014-12-11 DIAGNOSIS — I809 Phlebitis and thrombophlebitis of unspecified site: Secondary | ICD-10-CM

## 2014-12-11 LAB — PROTIME-INR
INR: 2.5 (ref 2.00–3.50)
Protime: 30 Seconds — ABNORMAL HIGH (ref 10.6–13.4)

## 2014-12-11 NOTE — Telephone Encounter (Signed)
pt called to r/s all appts due to work sched....done....pt ok adn aware of all dates

## 2014-12-11 NOTE — Telephone Encounter (Signed)
Per Ned Card, NP; left message to call office re: lab work.  PT/INR good, continue same dose of Coumadin and f/u with next lab appt 1/28.

## 2014-12-15 ENCOUNTER — Telehealth: Payer: Self-pay | Admitting: *Deleted

## 2014-12-15 NOTE — Telephone Encounter (Signed)
Made patient aware to continue same coumadin and recheck 01/08/15 as scheduled. 5 mg MWF and 7.5 mg all other days.

## 2014-12-15 NOTE — Telephone Encounter (Signed)
-----   Message from Ladell Pier, MD sent at 12/13/2014 11:08 AM EST ----- Please call patient, same coumadin, check PT 1 month

## 2014-12-30 ENCOUNTER — Other Ambulatory Visit: Payer: No Typology Code available for payment source

## 2015-01-08 ENCOUNTER — Other Ambulatory Visit (HOSPITAL_BASED_OUTPATIENT_CLINIC_OR_DEPARTMENT_OTHER): Payer: No Typology Code available for payment source

## 2015-01-08 DIAGNOSIS — D6852 Prothrombin gene mutation: Secondary | ICD-10-CM

## 2015-01-08 DIAGNOSIS — I809 Phlebitis and thrombophlebitis of unspecified site: Secondary | ICD-10-CM

## 2015-01-08 LAB — PROTIME-INR
INR: 2.3 (ref 2.00–3.50)
Protime: 27.6 Seconds — ABNORMAL HIGH (ref 10.6–13.4)

## 2015-01-09 ENCOUNTER — Telehealth: Payer: Self-pay | Admitting: *Deleted

## 2015-01-09 ENCOUNTER — Telehealth: Payer: Self-pay | Admitting: Oncology

## 2015-01-09 NOTE — Telephone Encounter (Signed)
-----   Message from Ladell Pier, MD sent at 01/08/2015  4:55 PM EST ----- Please call patient, same coumadin, check PT 1 month

## 2015-01-09 NOTE — Telephone Encounter (Signed)
Lft msg for pt confirming labs moved from 02/15 to 02/29 per 01/29 POF..... Mailed sch to pt... KJ

## 2015-01-09 NOTE — Telephone Encounter (Signed)
Left message at home # for pt to repeat lab in one month. Continue same Coumadin. Order to schedulers.

## 2015-01-26 ENCOUNTER — Other Ambulatory Visit: Payer: No Typology Code available for payment source

## 2015-01-27 ENCOUNTER — Other Ambulatory Visit: Payer: No Typology Code available for payment source

## 2015-02-09 ENCOUNTER — Telehealth: Payer: Self-pay | Admitting: *Deleted

## 2015-02-09 ENCOUNTER — Other Ambulatory Visit: Payer: No Typology Code available for payment source

## 2015-02-09 ENCOUNTER — Other Ambulatory Visit (HOSPITAL_BASED_OUTPATIENT_CLINIC_OR_DEPARTMENT_OTHER): Payer: No Typology Code available for payment source

## 2015-02-09 DIAGNOSIS — D6862 Lupus anticoagulant syndrome: Secondary | ICD-10-CM

## 2015-02-09 DIAGNOSIS — Z86718 Personal history of other venous thrombosis and embolism: Secondary | ICD-10-CM

## 2015-02-09 DIAGNOSIS — I809 Phlebitis and thrombophlebitis of unspecified site: Secondary | ICD-10-CM

## 2015-02-09 LAB — PROTIME-INR
INR: 2.7 (ref 2.00–3.50)
Protime: 32.4 Seconds — ABNORMAL HIGH (ref 10.6–13.4)

## 2015-02-09 NOTE — Telephone Encounter (Signed)
Per Dr. Benay Spice; left voice message on known voicemail to stay on same coumadin, check PT in 1 month; any questions, contact MD office.

## 2015-02-09 NOTE — Telephone Encounter (Signed)
-----   Message from Ladell Pier, MD sent at 02/09/2015  4:23 PM EST ----- Please call patient, same coumadin, check PT 1 month

## 2015-02-19 ENCOUNTER — Telehealth: Payer: Self-pay | Admitting: Oncology

## 2015-02-19 NOTE — Telephone Encounter (Signed)
Pt called lft msg to cancel labs on 03/14, cancelled labs..... KJ

## 2015-02-23 ENCOUNTER — Other Ambulatory Visit: Payer: No Typology Code available for payment source

## 2015-02-24 ENCOUNTER — Other Ambulatory Visit: Payer: No Typology Code available for payment source

## 2015-03-18 ENCOUNTER — Other Ambulatory Visit: Payer: Self-pay | Admitting: *Deleted

## 2015-03-18 DIAGNOSIS — I809 Phlebitis and thrombophlebitis of unspecified site: Secondary | ICD-10-CM

## 2015-03-19 ENCOUNTER — Other Ambulatory Visit (HOSPITAL_BASED_OUTPATIENT_CLINIC_OR_DEPARTMENT_OTHER): Payer: No Typology Code available for payment source

## 2015-03-19 DIAGNOSIS — Z86718 Personal history of other venous thrombosis and embolism: Secondary | ICD-10-CM | POA: Diagnosis not present

## 2015-03-19 DIAGNOSIS — I809 Phlebitis and thrombophlebitis of unspecified site: Secondary | ICD-10-CM

## 2015-03-19 LAB — PROTIME-INR
INR: 2.4 (ref 2.00–3.50)
Protime: 28.8 Seconds — ABNORMAL HIGH (ref 10.6–13.4)

## 2015-03-23 ENCOUNTER — Telehealth: Payer: Self-pay | Admitting: *Deleted

## 2015-03-23 NOTE — Telephone Encounter (Signed)
-----   Message from Ladell Pier, MD sent at 03/20/2015  5:18 PM EDT ----- Please call patient, same coumadin, fu as scheduled

## 2015-03-23 NOTE — Telephone Encounter (Signed)
Per Dr. Benay Spice; left voice message for pt to stay on same coumadin; any questions re: information--call [place] and gave #.  Next lab 04/23/15

## 2015-03-24 ENCOUNTER — Other Ambulatory Visit: Payer: No Typology Code available for payment source

## 2015-04-21 ENCOUNTER — Other Ambulatory Visit: Payer: No Typology Code available for payment source

## 2015-04-23 ENCOUNTER — Other Ambulatory Visit: Payer: No Typology Code available for payment source

## 2015-04-27 ENCOUNTER — Telehealth: Payer: Self-pay | Admitting: *Deleted

## 2015-04-27 ENCOUNTER — Telehealth: Payer: Self-pay | Admitting: Oncology

## 2015-04-27 NOTE — Telephone Encounter (Signed)
Left message on voicemail for pt to call office to reschedule missed lab appointment.

## 2015-04-27 NOTE — Telephone Encounter (Signed)
Lft msg for pt confirming labs added per 05/16 due to no show, also mailed schedule to pt... KJ

## 2015-05-04 ENCOUNTER — Other Ambulatory Visit (HOSPITAL_BASED_OUTPATIENT_CLINIC_OR_DEPARTMENT_OTHER): Payer: No Typology Code available for payment source

## 2015-05-04 DIAGNOSIS — Z86718 Personal history of other venous thrombosis and embolism: Secondary | ICD-10-CM

## 2015-05-04 DIAGNOSIS — I809 Phlebitis and thrombophlebitis of unspecified site: Secondary | ICD-10-CM

## 2015-05-04 LAB — PROTIME-INR
INR: 2.5 (ref 2.00–3.50)
Protime: 30 Seconds — ABNORMAL HIGH (ref 10.6–13.4)

## 2015-05-05 ENCOUNTER — Telehealth: Payer: Self-pay | Admitting: *Deleted

## 2015-05-05 ENCOUNTER — Telehealth: Payer: Self-pay | Admitting: Oncology

## 2015-05-05 DIAGNOSIS — I809 Phlebitis and thrombophlebitis of unspecified site: Secondary | ICD-10-CM

## 2015-05-05 NOTE — Telephone Encounter (Signed)
Left message for pt to call office re: lab work.

## 2015-05-05 NOTE — Telephone Encounter (Signed)
Lft msg for pt confirming labs added per 05/24 POF..... Mailed schedule to pt.... KJ

## 2015-05-05 NOTE — Telephone Encounter (Signed)
-----   Message from Ladell Pier, MD sent at 05/04/2015  6:11 PM EDT ----- Please call patient, same coumadin, check PT 1 month

## 2015-05-19 ENCOUNTER — Other Ambulatory Visit: Payer: No Typology Code available for payment source

## 2015-05-21 ENCOUNTER — Other Ambulatory Visit: Payer: No Typology Code available for payment source

## 2015-05-22 ENCOUNTER — Telehealth: Payer: Self-pay | Admitting: Oncology

## 2015-05-22 ENCOUNTER — Other Ambulatory Visit (HOSPITAL_BASED_OUTPATIENT_CLINIC_OR_DEPARTMENT_OTHER): Payer: No Typology Code available for payment source

## 2015-05-22 DIAGNOSIS — Z86718 Personal history of other venous thrombosis and embolism: Secondary | ICD-10-CM

## 2015-05-22 DIAGNOSIS — I809 Phlebitis and thrombophlebitis of unspecified site: Secondary | ICD-10-CM

## 2015-05-22 LAB — PROTIME-INR
INR: 2.2 (ref 2.00–3.50)
Protime: 26.4 Seconds — ABNORMAL HIGH (ref 10.6–13.4)

## 2015-05-22 NOTE — Telephone Encounter (Addendum)
S/w pt confirming labs today due to death in family where she missed last visit .... KJ, also gave pt updated schedule where we had to r/s pt 09/01 due to provider schedule.Marland KitchenMarland Kitchen

## 2015-05-25 ENCOUNTER — Telehealth: Payer: Self-pay | Admitting: *Deleted

## 2015-05-25 NOTE — Telephone Encounter (Signed)
Left message on voicemail instructing pt to continue same dose of Coumadin, check PT INR in one month. 6/23 appt canceled. (Pt off schedule due to missed appointment last month.)

## 2015-05-25 NOTE — Telephone Encounter (Signed)
-----   Message from Ladell Pier, MD sent at 05/24/2015  4:54 PM EDT ----- Please call patient, same coumadin, check PT 1 month

## 2015-06-04 ENCOUNTER — Other Ambulatory Visit: Payer: No Typology Code available for payment source

## 2015-06-16 ENCOUNTER — Other Ambulatory Visit: Payer: No Typology Code available for payment source

## 2015-06-17 ENCOUNTER — Telehealth: Payer: Self-pay | Admitting: Oncology

## 2015-06-17 NOTE — Telephone Encounter (Signed)
Pt called to r/s labs/ov due to new job she is starting on 2nd shift pt confirmed labs/ov and will put a copy of schedule up front for pt to p/u next visit.... KJ

## 2015-06-22 ENCOUNTER — Other Ambulatory Visit: Payer: No Typology Code available for payment source

## 2015-06-22 ENCOUNTER — Other Ambulatory Visit (HOSPITAL_BASED_OUTPATIENT_CLINIC_OR_DEPARTMENT_OTHER): Payer: No Typology Code available for payment source

## 2015-06-22 DIAGNOSIS — I809 Phlebitis and thrombophlebitis of unspecified site: Secondary | ICD-10-CM

## 2015-06-22 DIAGNOSIS — Z86718 Personal history of other venous thrombosis and embolism: Secondary | ICD-10-CM

## 2015-06-22 LAB — PROTIME-INR
INR: 2.9 (ref 2.00–3.50)
Protime: 34.8 Seconds — ABNORMAL HIGH (ref 10.6–13.4)

## 2015-06-23 ENCOUNTER — Telehealth: Payer: Self-pay | Admitting: *Deleted

## 2015-06-23 NOTE — Telephone Encounter (Signed)
-----   Message from Ladell Pier, MD sent at 06/22/2015  8:41 PM EDT ----- Please call patient, same coumadin, check PT 1 month

## 2015-06-23 NOTE — Telephone Encounter (Signed)
Called pt with instructions to continue current Coumadin dose, check PT in one month. She voiced understanding. Confirmed she is taking 7.5 mg MWF and 5 mg all other days.

## 2015-07-14 ENCOUNTER — Other Ambulatory Visit: Payer: No Typology Code available for payment source

## 2015-07-20 ENCOUNTER — Other Ambulatory Visit: Payer: No Typology Code available for payment source

## 2015-07-21 ENCOUNTER — Ambulatory Visit (HOSPITAL_BASED_OUTPATIENT_CLINIC_OR_DEPARTMENT_OTHER): Payer: No Typology Code available for payment source

## 2015-07-21 DIAGNOSIS — Z86718 Personal history of other venous thrombosis and embolism: Secondary | ICD-10-CM | POA: Diagnosis not present

## 2015-07-21 DIAGNOSIS — I809 Phlebitis and thrombophlebitis of unspecified site: Secondary | ICD-10-CM

## 2015-07-21 LAB — PROTIME-INR
INR: 2.1 (ref 2.00–3.50)
Protime: 25.2 Seconds — ABNORMAL HIGH (ref 10.6–13.4)

## 2015-07-22 ENCOUNTER — Telehealth: Payer: Self-pay | Admitting: *Deleted

## 2015-07-22 NOTE — Telephone Encounter (Signed)
Left message on voicemail for pt to continue same dose, repeat lab in one month.

## 2015-07-22 NOTE — Telephone Encounter (Signed)
-----   Message from Ladell Pier, MD sent at 07/21/2015  1:44 PM EDT ----- Please call patient, same coumadin, check Pt 1 month

## 2015-08-13 ENCOUNTER — Ambulatory Visit: Payer: No Typology Code available for payment source | Admitting: Oncology

## 2015-08-13 ENCOUNTER — Other Ambulatory Visit: Payer: No Typology Code available for payment source

## 2015-08-14 ENCOUNTER — Telehealth: Payer: Self-pay | Admitting: Nurse Practitioner

## 2015-08-14 ENCOUNTER — Ambulatory Visit (HOSPITAL_BASED_OUTPATIENT_CLINIC_OR_DEPARTMENT_OTHER): Payer: No Typology Code available for payment source | Admitting: Nurse Practitioner

## 2015-08-14 ENCOUNTER — Other Ambulatory Visit (HOSPITAL_BASED_OUTPATIENT_CLINIC_OR_DEPARTMENT_OTHER): Payer: No Typology Code available for payment source

## 2015-08-14 VITALS — BP 135/66 | HR 56 | Temp 97.9°F | Resp 18 | Ht 67.0 in | Wt 224.8 lb

## 2015-08-14 DIAGNOSIS — I809 Phlebitis and thrombophlebitis of unspecified site: Secondary | ICD-10-CM

## 2015-08-14 DIAGNOSIS — Z86718 Personal history of other venous thrombosis and embolism: Secondary | ICD-10-CM | POA: Diagnosis not present

## 2015-08-14 DIAGNOSIS — D509 Iron deficiency anemia, unspecified: Secondary | ICD-10-CM

## 2015-08-14 DIAGNOSIS — Z862 Personal history of diseases of the blood and blood-forming organs and certain disorders involving the immune mechanism: Secondary | ICD-10-CM

## 2015-08-14 LAB — CBC WITH DIFFERENTIAL/PLATELET
BASO%: 0.3 % (ref 0.0–2.0)
Basophils Absolute: 0 10*3/uL (ref 0.0–0.1)
EOS%: 5.4 % (ref 0.0–7.0)
Eosinophils Absolute: 0.3 10*3/uL (ref 0.0–0.5)
HCT: 36.7 % (ref 34.8–46.6)
HGB: 12.3 g/dL (ref 11.6–15.9)
LYMPH%: 37.5 % (ref 14.0–49.7)
MCH: 29.9 pg (ref 25.1–34.0)
MCHC: 33.5 g/dL (ref 31.5–36.0)
MCV: 89.1 fL (ref 79.5–101.0)
MONO#: 0.4 10*3/uL (ref 0.1–0.9)
MONO%: 7.1 % (ref 0.0–14.0)
NEUT#: 2.9 10*3/uL (ref 1.5–6.5)
NEUT%: 49.7 % (ref 38.4–76.8)
Platelets: 283 10*3/uL (ref 145–400)
RBC: 4.12 10*6/uL (ref 3.70–5.45)
RDW: 13.2 % (ref 11.2–14.5)
WBC: 5.7 10*3/uL (ref 3.9–10.3)
lymph#: 2.2 10*3/uL (ref 0.9–3.3)
nRBC: 0 % (ref 0–0)

## 2015-08-14 LAB — PROTIME-INR
INR: 2.4 (ref 2.00–3.50)
Protime: 28.8 Seconds — ABNORMAL HIGH (ref 10.6–13.4)

## 2015-08-14 NOTE — Telephone Encounter (Signed)
per pof to sch pt appt-gave pt copy of avs °

## 2015-08-14 NOTE — Progress Notes (Signed)
  [facility] OFFICE PROGRESS NOTE   Diagnosis:  Venous thrombosis  INTERVAL HISTORY:   [name] [name] returns as scheduled. She continues Coumadin. She denies any bleeding. No sign of recurrent thrombosis. No interim illnesses or infections.  Objective:  Vital signs in last 24 hours:  Blood pressure 135/66, pulse 56, temperature 97.9 F (36.6 C), temperature source Oral, resp. rate 18, height 5\' 7"  (1.702 m), weight 224 lb 12.8 oz (101.969 kg), SpO2 100 %.    HEENT: No thrush or ulcers. Resp: Lungs clear bilaterally. Cardio: Regular rate and rhythm. GI: Abdomen soft and nontender. No organomegaly. Vascular: No leg edema.  Lab Results:  Lab Results  Component Value Date   WBC 5.7 08/14/2015   HGB 12.3 08/14/2015   HCT 36.7 08/14/2015   MCV 89.1 08/14/2015   PLT 283 08/14/2015   NEUTROABS 2.9 08/14/2015    Imaging:  No results found.  Medications: I have reviewed the patient's current medications.  Assessment/Plan: 1. 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 2. 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 Repeat lupus and anticardiolipin antibody testing was negative until there was a positive lupus anticoagulant panel on 05/27/2008. There was a positive lupus anticoagulant and hexagonal phase study on 10/27/2009. She would like to continue Coumadin anticoagulation.  3. Anemia secondary to menorrhagia and iron deficiency. The hemoglobin was normal in February of 2015 and she is now amenorrheic. Hemoglobin normal 08/14/2015. 4. Chronic bilateral leg pain.   Disposition: [name] [name] appears stable. She will continue Coumadin. She will return for a PT/INR in 4 weeks. We scheduled a follow-up visit in one year. She understands to contact the office with bleeding or signs/symptoms of a blood clot.  We discussed health maintenance. She plans to contact her PCP for an  appointment to discuss recommended screenings.    Ned Card ANP/GNP-BC   08/14/2015  9:39 AM

## 2015-10-13 ENCOUNTER — Other Ambulatory Visit: Payer: Self-pay | Admitting: Oncology

## 2016-01-04 ENCOUNTER — Telehealth: Payer: Self-pay | Admitting: Nurse Practitioner

## 2016-01-04 NOTE — Telephone Encounter (Signed)
Pt called to confirmed lab apt..... KJ

## 2016-01-07 ENCOUNTER — Emergency Department (HOSPITAL_COMMUNITY)
Admission: EM | Admit: 2016-01-07 | Discharge: 2016-01-08 | Disposition: A | Discharge status: Home / Self Care | Payer: No Typology Code available for payment source | Attending: Emergency Medicine | Admitting: Emergency Medicine

## 2016-01-07 ENCOUNTER — Encounter (HOSPITAL_COMMUNITY): Payer: Self-pay | Admitting: Emergency Medicine

## 2016-01-07 ENCOUNTER — Emergency Department (HOSPITAL_COMMUNITY): Payer: No Typology Code available for payment source

## 2016-01-07 DIAGNOSIS — Z87891 Personal history of nicotine dependence: Secondary | ICD-10-CM | POA: Diagnosis not present

## 2016-01-07 DIAGNOSIS — R1031 Right lower quadrant pain: Secondary | ICD-10-CM | POA: Insufficient documentation

## 2016-01-07 DIAGNOSIS — Z86718 Personal history of other venous thrombosis and embolism: Secondary | ICD-10-CM | POA: Insufficient documentation

## 2016-01-07 DIAGNOSIS — Z3202 Encounter for pregnancy test, result negative: Secondary | ICD-10-CM | POA: Insufficient documentation

## 2016-01-07 DIAGNOSIS — Z7901 Long term (current) use of anticoagulants: Secondary | ICD-10-CM | POA: Diagnosis not present

## 2016-01-07 LAB — COMPREHENSIVE METABOLIC PANEL
ALT: 33 U/L (ref 14–54)
AST: 29 U/L (ref 15–41)
Albumin: 4.5 g/dL (ref 3.5–5.0)
Alkaline Phosphatase: 88 U/L (ref 38–126)
Anion gap: 9 (ref 5–15)
BUN: 15 mg/dL (ref 6–20)
CO2: 27 mmol/L (ref 22–32)
Calcium: 9.5 mg/dL (ref 8.9–10.3)
Chloride: 105 mmol/L (ref 101–111)
Creatinine, Ser: 0.8 mg/dL (ref 0.44–1.00)
GFR calc Af Amer: >60 mL/min (ref >60)
GFR calc non Af Amer: >60 mL/min (ref >60)
Glucose, Bld: 103 mg/dL — ABNORMAL HIGH (ref 65–99)
Potassium: 3.7 mmol/L (ref 3.5–5.1)
Sodium: 141 mmol/L (ref 135–145)
Total Bilirubin: 0.5 mg/dL (ref 0.3–1.2)
Total Protein: 8.1 g/dL (ref 6.5–8.1)

## 2016-01-07 LAB — URINE MICROSCOPIC-ADD ON

## 2016-01-07 LAB — CBC
HCT: 39.9 % (ref 36.0–46.0)
Hemoglobin: 12.7 g/dL (ref 12.0–15.0)
MCH: 29.5 pg (ref 26.0–34.0)
MCHC: 31.8 g/dL (ref 30.0–36.0)
MCV: 92.6 fL (ref 78.0–100.0)
Platelets: 367 10*3/uL (ref 150–400)
RBC: 4.31 MIL/uL (ref 3.87–5.11)
RDW: 13.6 % (ref 11.5–15.5)
WBC: 9 10*3/uL (ref 4.0–10.5)

## 2016-01-07 LAB — URINALYSIS, ROUTINE W REFLEX MICROSCOPIC
Bilirubin Urine: NEGATIVE
Glucose, UA: NEGATIVE mg/dL
Hgb urine dipstick: NEGATIVE
Ketones, ur: NEGATIVE mg/dL
Nitrite: NEGATIVE
Protein, ur: NEGATIVE mg/dL
Specific Gravity, Urine: 1.025 (ref 1.005–1.030)
pH: 5 (ref 5.0–8.0)

## 2016-01-07 LAB — LIPASE, BLOOD: Lipase: 42 U/L (ref 11–51)

## 2016-01-07 MED ORDER — ONDANSETRON HCL 4 MG/2ML IJ SOLN
4.0000 mg | Freq: Once | INTRAMUSCULAR | Status: AC
  Administered 2016-01-08: 4 mg via INTRAVENOUS
  Filled 2016-01-07: qty 2

## 2016-01-07 MED ORDER — SODIUM CHLORIDE 0.9 % IV SOLN
Freq: Once | INTRAVENOUS | Status: AC
  Administered 2016-01-08: via INTRAVENOUS

## 2016-01-07 MED ORDER — HYDROMORPHONE HCL 1 MG/ML IJ SOLN
0.5000 mg | Freq: Once | INTRAMUSCULAR | Status: AC
  Administered 2016-01-08: 0.5 mg via INTRAVENOUS
  Filled 2016-01-07: qty 1

## 2016-01-07 NOTE — ED Notes (Signed)
Pt c/o right lower abd pain x 1 day, started 1300 today. Denies n/v.

## 2016-01-08 ENCOUNTER — Encounter (HOSPITAL_COMMUNITY): Payer: Self-pay

## 2016-01-08 LAB — PROTIME-INR
INR: 2.5 — ABNORMAL HIGH (ref 0.00–1.49)
Prothrombin Time: 25.9 seconds — ABNORMAL HIGH (ref 11.6–15.2)

## 2016-01-08 LAB — PREGNANCY, URINE: Preg Test, Ur: NEGATIVE

## 2016-01-08 MED ORDER — OXYCODONE-ACETAMINOPHEN 5-325 MG PO TABS: 1.0000 | ORAL_TABLET | ORAL | Status: DC | PRN

## 2016-01-08 MED ORDER — IOHEXOL 300 MG/ML  SOLN
100.0000 mL | Freq: Once | INTRAMUSCULAR | Status: AC | PRN
  Administered 2016-01-08: 100 mL via INTRAVENOUS

## 2016-01-08 NOTE — ED Provider Notes (Signed)
55 year old female comes in with right suprapubic pain which started in the morning of January 26. There is no radiation of pain, nausea, vomiting. No fever or chills. On exam, tenderness is very well localized to the right suprapubic area. She was sent for CT of abdomen and pelvis which showed no acute process. Laboratory workup is unremarkable. She is therapeutically anticoagulated on warfarin, but no evidence of hematoma formation. Patient is reassured about these findings and discharged with prescription for oxycodone have acetaminophen for pain. Return precautions given. Plan to follow-up with PCP in 3 days for recheck.  Results for orders placed or performed during the hospital encounter of 01/07/16  Lipase, blood  Result Value Ref Range   Lipase 42 11 - 51 U/L  Comprehensive metabolic panel  Result Value Ref Range   Sodium 141 135 - 145 mmol/L   Potassium 3.7 3.5 - 5.1 mmol/L   Chloride 105 101 - 111 mmol/L   CO2 27 22 - 32 mmol/L   Glucose, Bld 103 (H) 65 - 99 mg/dL   BUN 15 6 - 20 mg/dL   Creatinine, Ser 0.80 0.44 - 1.00 mg/dL   Calcium 9.5 8.9 - 10.3 mg/dL   Total Protein 8.1 6.5 - 8.1 g/dL   Albumin 4.5 3.5 - 5.0 g/dL   AST 29 15 - 41 U/L   ALT 33 14 - 54 U/L   Alkaline Phosphatase 88 38 - 126 U/L   Total Bilirubin 0.5 0.3 - 1.2 mg/dL   GFR calc non Af Amer >60 >60 mL/min   GFR calc Af Amer >60 >60 mL/min   Anion gap 9 5 - 15  CBC  Result Value Ref Range   WBC 9.0 4.0 - 10.5 K/uL   RBC 4.31 3.87 - 5.11 MIL/uL   Hemoglobin 12.7 12.0 - 15.0 g/dL   HCT 39.9 36.0 - 46.0 %   MCV 92.6 78.0 - 100.0 fL   MCH 29.5 26.0 - 34.0 pg   MCHC 31.8 30.0 - 36.0 g/dL   RDW 13.6 11.5 - 15.5 %   Platelets 367 150 - 400 K/uL  Urinalysis, Routine w reflex microscopic (not at [facility])  Result Value Ref Range   Color, Urine YELLOW YELLOW   APPearance CLOUDY (A) CLEAR   Specific Gravity, Urine 1.025 1.005 - 1.030   pH 5.0 5.0 - 8.0   Glucose, UA NEGATIVE NEGATIVE mg/dL   Hgb urine dipstick  NEGATIVE NEGATIVE   Bilirubin Urine NEGATIVE NEGATIVE   Ketones, ur NEGATIVE NEGATIVE mg/dL   Protein, ur NEGATIVE NEGATIVE mg/dL   Nitrite NEGATIVE NEGATIVE   Leukocytes, UA SMALL (A) NEGATIVE  Urine microscopic-add on  Result Value Ref Range   Squamous Epithelial / LPF 6-30 (A) NONE SEEN   WBC, UA 6-30 0 - 5 WBC/hpf   RBC / HPF 0-5 0 - 5 RBC/hpf   Bacteria, UA FEW (A) NONE SEEN  Protime-INR  Result Value Ref Range   Prothrombin Time 25.9 (H) 11.6 - 15.2 seconds   INR 2.50 (H) 0.00 - 1.49  Pregnancy, urine  Result Value Ref Range   Preg Test, Ur NEGATIVE NEGATIVE   Ct Abdomen Pelvis W Contrast  01/08/2016  CLINICAL DATA:  Right lower quadrant pain since 1300 hours on 01/07/2016. White cell count 9. EXAM: CT ABDOMEN AND PELVIS WITH CONTRAST TECHNIQUE: Multidetector CT imaging of the abdomen and pelvis was performed using the standard protocol following bolus administration of intravenous contrast. CONTRAST:  145mL OMNIPAQUE IOHEXOL 300 MG/ML  SOLN COMPARISON:  None. FINDINGS: Atelectasis in the lung bases. Moderate-sized esophageal hiatal hernia. Sub cm lesion in the medial segment left lobe of the liver probably represents a small cyst. The gallbladder, spleen, pancreas, kidneys, abdominal aorta, inferior vena cava, and retroperitoneal lymph nodes are unremarkable. There is focal decompression of the origin of the left iliac vein with collateral veins demonstrated in the pelvis and anterior pelvic soft tissues. This may represent compression of the vein by the crossing right iliac artery. No evidence of significant distention of the left iliac vein. 10 mm right adrenal gland nodule statistically likely to represent adenoma. Stomach, small bowel, and colon are not abnormally distended. Stool fills the colon. No free air or free fluid in the abdomen. Small umbilical hernia containing fat. Pelvis: Uterus and ovaries are not enlarged. No free or loculated pelvic fluid collections. No pelvic mass or  lymphadenopathy. Bladder wall is not thickened. The appendix is diminutive but appears normal. Postoperative changes with posterior fixation of the lower lumbar spine from L3 to the sacrum. Mild scoliosis. IMPRESSION: Moderate-sized esophageal hiatal hernia. No evidence of bowel obstruction or inflammation. Appendix is normal. Incidental note of prominent venous collaterals in the pelvis with decompression of the origin of the left iliac vein possibly due to compression of the overlying right iliac artery. Electronically Signed   By: Lucienne Capers M.D.   On: 01/08/2016 01:27    Images viewed by me.  Medical screening examination/treatment/procedure(s) were conducted as a shared visit with non-physician practitioner(s) and myself.  I personally evaluated the patient during the encounter.    Delora Fuel, MD [id number]

## 2016-01-08 NOTE — Discharge Instructions (Signed)
Return if pain gets worse, you start running a fever, or start vomiting.   Abdominal Pain, Adult Many things can cause abdominal pain. Usually, abdominal pain is not caused by a disease and will improve without treatment. It can often be observed and treated at home. Your health care provider will do a physical exam and possibly order blood tests and X-rays to help determine the seriousness of your pain. However, in many cases, more time must pass before a clear cause of the pain can be found. Before that point, your health care provider may not know if you need more testing or further treatment. HOME CARE INSTRUCTIONS Monitor your abdominal pain for any changes. The following actions may help to alleviate any discomfort you are experiencing:  Only take over-the-counter or prescription medicines as directed by your health care provider.  Do not take laxatives unless directed to do so by your health care provider.  Try a clear liquid diet (broth, tea, or water) as directed by your health care provider. Slowly move to a bland diet as tolerated. SEEK MEDICAL CARE IF:  You have unexplained abdominal pain.  You have abdominal pain associated with nausea or diarrhea.  You have pain when you urinate or have a bowel movement.  You experience abdominal pain that wakes you in the night.  You have abdominal pain that is worsened or improved by eating food.  You have abdominal pain that is worsened with eating fatty foods.  You have a fever. SEEK IMMEDIATE MEDICAL CARE IF:  Your pain does not go away within 2 hours.  You keep throwing up (vomiting).  Your pain is felt only in portions of the abdomen, such as the right side or the left lower portion of the abdomen.  You pass bloody or black tarry stools. MAKE SURE YOU:  Understand these instructions.  Will watch your condition.  Will get help right away if you are not doing well or get worse.   This information is not intended to  replace advice given to you by your health care provider. Make sure you discuss any questions you have with your health care provider.   Document Released: 09/07/2005 Document Revised: 08/19/2015 Document Reviewed: 08/07/2013 Elsevier Interactive Patient Education 2016 Elsevier Inc.  Acetaminophen; Oxycodone tablets What is this medicine? ACETAMINOPHEN; OXYCODONE (a set a MEE noe fen; ox i KOE done) is a pain reliever. It is used to treat moderate to severe pain. This medicine may be used for other purposes; ask your health care provider or pharmacist if you have questions. What should I tell my health care provider before I take this medicine? They need to know if you have any of these conditions: -brain tumor -Crohn's disease, inflammatory bowel disease, or ulcerative colitis -drug abuse or addiction -head injury -heart or circulation problems -if you often drink alcohol -kidney disease or problems going to the bathroom -liver disease -lung disease, asthma, or breathing problems -an unusual or allergic reaction to acetaminophen, oxycodone, other opioid analgesics, other medicines, foods, dyes, or preservatives -pregnant or trying to get pregnant -breast-feeding How should I use this medicine? Take this medicine by mouth with a full glass of water. Follow the directions on the prescription label. You can take it with or without food. If it upsets your stomach, take it with food. Take your medicine at regular intervals. Do not take it more often than directed. Talk to your pediatrician regarding the use of this medicine in children. Special care may be needed. Patients  over 8 years old may have a stronger reaction and need a smaller dose. Overdosage: If you think you have taken too much of this medicine contact a poison control center or emergency room at once. NOTE: This medicine is only for you. Do not share this medicine with others. What if I miss a dose? If you miss a dose, take  it as soon as you can. If it is almost time for your next dose, take only that dose. Do not take double or extra doses. What may interact with this medicine? -alcohol -antihistamines -barbiturates like amobarbital, butalbital, butabarbital, methohexital, pentobarbital, phenobarbital, thiopental, and secobarbital -benztropine -drugs for bladder problems like solifenacin, trospium, oxybutynin, tolterodine, hyoscyamine, and methscopolamine -drugs for breathing problems like ipratropium and tiotropium -drugs for certain stomach or intestine problems like propantheline, homatropine methylbromide, glycopyrrolate, atropine, belladonna, and dicyclomine -general anesthetics like etomidate, ketamine, nitrous oxide, propofol, desflurane, enflurane, halothane, isoflurane, and sevoflurane -medicines for depression, anxiety, or psychotic disturbances -medicines for sleep -muscle relaxants -naltrexone -narcotic medicines (opiates) for pain -phenothiazines like perphenazine, thioridazine, chlorpromazine, mesoridazine, fluphenazine, prochlorperazine, promazine, and trifluoperazine -scopolamine -tramadol -trihexyphenidyl This list may not describe all possible interactions. Give your health care provider a list of all the medicines, herbs, non-prescription drugs, or dietary supplements you use. Also tell them if you smoke, drink alcohol, or use illegal drugs. Some items may interact with your medicine. What should I watch for while using this medicine? Tell your doctor or health care professional if your pain does not go away, if it gets worse, or if you have new or a different type of pain. You may develop tolerance to the medicine. Tolerance means that you will need a higher dose of the medication for pain relief. Tolerance is normal and is expected if you take this medicine for a long time. Do not suddenly stop taking your medicine because you may develop a severe reaction. Your body becomes used to the  medicine. This does NOT mean you are addicted. Addiction is a behavior related to getting and using a drug for a non-medical reason. If you have pain, you have a medical reason to take pain medicine. Your doctor will tell you how much medicine to take. If your doctor wants you to stop the medicine, the dose will be slowly lowered over time to avoid any side effects. You may get drowsy or dizzy. Do not drive, use machinery, or do anything that needs mental alertness until you know how this medicine affects you. Do not stand or sit up quickly, especially if you are an older patient. This reduces the risk of dizzy or fainting spells. Alcohol may interfere with the effect of this medicine. Avoid alcoholic drinks. There are different types of narcotic medicines (opiates) for pain. If you take more than one type at the same time, you may have more side effects. Give your health care provider a list of all medicines you use. Your doctor will tell you how much medicine to take. Do not take more medicine than directed. Call emergency for help if you have problems breathing. The medicine will cause constipation. Try to have a bowel movement at least every 2 to 3 days. If you do not have a bowel movement for 3 days, call your doctor or health care professional. Do not take Tylenol (acetaminophen) or medicines that have acetaminophen with this medicine. Too much acetaminophen can be very dangerous. Many nonprescription medicines contain acetaminophen. Always read the labels carefully to avoid taking more acetaminophen. What side  effects may I notice from receiving this medicine? Side effects that you should report to your doctor or health care professional as soon as possible: -allergic reactions like skin rash, itching or hives, swelling of the face, lips, or tongue -breathing difficulties, wheezing -confusion -light headedness or fainting spells -severe stomach pain -unusually weak or tired -yellowing of the skin  or the whites of the eyes Side effects that usually do not require medical attention (report to your doctor or health care professional if they continue or are bothersome): -dizziness -drowsiness -nausea -vomiting This list may not describe all possible side effects. Call your doctor for medical advice about side effects. You may report side effects to FDA at 1-800-FDA-1088. Where should I keep my medicine? Keep out of the reach of children. This medicine can be abused. Keep your medicine in a safe place to protect it from theft. Do not share this medicine with anyone. Selling or giving away this medicine is dangerous and against the law. This medicine may cause accidental overdose and death if it taken by other adults, children, or pets. Mix any unused medicine with a substance like cat litter or coffee grounds. Then throw the medicine away in a sealed container like a sealed bag or a coffee can with a lid. Do not use the medicine after the expiration date. Store at room temperature between 20 and 25 degrees C (68 and 77 degrees F). NOTE: This sheet is a summary. It may not cover all possible information. If you have questions about this medicine, talk to your doctor, pharmacist, or health care provider.    2016, Elsevier/Gold Standard. (2014-10-29 15:18:46)

## 2016-01-08 NOTE — ED Provider Notes (Signed)
CSN: [id number]     Arrival date & time 01/07/16  2148 History   First MD Initiated Contact with Patient 01/07/16 2317     Chief Complaint  Patient presents with  . Abdominal Pain    right     (Consider location/radiation/quality/duration/timing/severity/associated sxs/prior Treatment) Patient is a 55 y.o. female presenting with abdominal pain. The history is provided by the patient. No language interpreter was used.  Abdominal Pain Pain location:  RLQ Pain quality: aching   Pain radiates to:  Does not radiate Pain severity:  Moderate Onset quality:  Gradual Duration:  2 days Timing:  Constant Progression:  Worsening Chronicity:  New Context: not diet changes   Relieved by:  Nothing Worsened by:  Nothing tried Ineffective treatments:  None tried Associated symptoms: no chest pain, no diarrhea and no vomiting   Risk factors: not pregnant   Pt has a history of DVT's She sees Dr. Benay Spice in Oncology for coumadin management  Past Medical History  Diagnosis Date  . DVT (deep venous thrombosis) ([place])     left leg   Past Surgical History  Procedure Laterality Date  . Spine surgery     History reviewed. No pertinent family history. Social History  Substance Use Topics  . Smoking status: Former Research scientist (life sciences)  . Smokeless tobacco: None  . Alcohol Use: None   OB History    No data available     Review of Systems  Cardiovascular: Negative for chest pain.  Gastrointestinal: Positive for abdominal pain. Negative for vomiting and diarrhea.  All other systems reviewed and are negative.     Allergies  Review of patient's allergies indicates no known allergies.  Home Medications   Prior to Admission medications   Medication Sig Start Date End Date Taking? Authorizing Provider  acetaminophen (TYLENOL) 325 MG tablet Take 650 mg by mouth every 6 (six) hours as needed.     Yes Historical Provider, MD  diphenhydramine-acetaminophen (TYLENOL PM) 25-500 MG TABS tablet Take 1 tablet  by mouth at bedtime as needed (sleep).   Yes Historical Provider, MD  warfarin (COUMADIN) 5 MG tablet TAKE ONE TABLET BY MOUTH ONCE DAILY ON MONDAY, WEDNESDAY, AND FRIDAY AND TAKE ONE AND ONE-HALF TABLET ONCE DAILY ON ALL OTHER DAYS 10/13/15  Yes Ladell Pier, MD   BP 169/93 mmHg  Pulse 89  Temp(Src) 98 F (36.7 C) (Oral)  Resp 20  SpO2 98% Physical Exam  Constitutional: She is oriented to person, place, and time. She appears well-developed and well-nourished.  HENT:  Head: Normocephalic.  Right Ear: External ear normal.  Nose: Nose normal.  Mouth/Throat: Oropharynx is clear and moist.  Eyes: Conjunctivae and EOM are normal. Pupils are equal, round, and reactive to light.  Neck: Normal range of motion.  Cardiovascular: Normal rate and normal heart sounds.   Pulmonary/Chest: Effort normal.  Abdominal: She exhibits no distension and no mass. There is tenderness. There is no rebound and no guarding.  Musculoskeletal: Normal range of motion.  Neurological: She is alert and oriented to person, place, and time.  Skin: Skin is warm.  Psychiatric: She has a normal mood and affect.  Nursing note and vitals reviewed.   ED Course  Procedures (including critical care time) Labs Review Labs Reviewed  COMPREHENSIVE METABOLIC PANEL - Abnormal; Notable for the following:    Glucose, Bld 103 (*)    All other components within normal limits  URINALYSIS, ROUTINE W REFLEX MICROSCOPIC (NOT AT [facility]) - Abnormal; Notable for the following:  APPearance CLOUDY (*)    Leukocytes, UA SMALL (*)    All other components within normal limits  URINE MICROSCOPIC-ADD ON - Abnormal; Notable for the following:    Squamous Epithelial / LPF 6-30 (*)    Bacteria, UA FEW (*)    All other components within normal limits  LIPASE, BLOOD  CBC    Imaging Review No results found. I have personally reviewed and evaluated these images and lab results as part of my medical decision-making.   EKG  Interpretation None      MDM  Pt's care turned over to Dr. Roxanne Mins at [id number]   Final diagnoses:  Suprapubic pain, right  Anticoagulated on warfarin         Fransico Meadow, PA-C [id number]  David Glick, MD [id number] [phone]

## 2016-01-08 NOTE — ED Notes (Signed)
Pt to CT

## 2016-03-17 ENCOUNTER — Telehealth: Payer: Self-pay | Admitting: *Deleted

## 2016-03-17 DIAGNOSIS — I809 Phlebitis and thrombophlebitis of unspecified site: Secondary | ICD-10-CM

## 2016-03-17 NOTE — Telephone Encounter (Signed)
Message from pt requesting call to discuss "busting veins in my fingers." Pt requests call 4/7 AM, "I'm at work now." Will follow up with pt in the AM.

## 2016-03-18 NOTE — Telephone Encounter (Signed)
Left another message for pt to call office. Need to clarify what she means by veins busting in her fingers. Order entered for PT INR on 4/10. Pt should wait for results.

## 2016-03-18 NOTE — Telephone Encounter (Signed)
Returned call to pt, no answer. Requested she call back.

## 2016-03-21 ENCOUNTER — Other Ambulatory Visit: Payer: Self-pay | Admitting: *Deleted

## 2016-03-21 ENCOUNTER — Telehealth: Payer: Self-pay | Admitting: *Deleted

## 2016-03-21 ENCOUNTER — Other Ambulatory Visit (HOSPITAL_BASED_OUTPATIENT_CLINIC_OR_DEPARTMENT_OTHER): Payer: No Typology Code available for payment source

## 2016-03-21 ENCOUNTER — Telehealth: Payer: Self-pay | Admitting: Oncology

## 2016-03-21 DIAGNOSIS — D509 Iron deficiency anemia, unspecified: Secondary | ICD-10-CM

## 2016-03-21 DIAGNOSIS — I809 Phlebitis and thrombophlebitis of unspecified site: Secondary | ICD-10-CM

## 2016-03-21 LAB — PROTIME-INR
INR: 2.6 (ref 2.00–3.50)
Protime: 31.2 Seconds — ABNORMAL HIGH (ref 10.6–13.4)

## 2016-03-21 NOTE — Telephone Encounter (Signed)
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 No bruising noted on fingers. No other signs of bleeding. Did not have labs drawn today, will come on Wednesday at 0930. Knows to wait for results. Will call if further problems

## 2016-03-21 NOTE — Telephone Encounter (Signed)
per pf to sch pt appt-sch and pt is aware

## 2016-03-21 NOTE — Telephone Encounter (Signed)
Per Dr. Benay Spice, pt notified to continue Coumadin 5 mg MWF and 7.5 mg all other days.  Pt verbalizes an understanding of information.  Teach back done.  POF placed for labs in 2 months.

## 2016-03-21 NOTE — Telephone Encounter (Signed)
-----   Message from Ladell Pier, MD sent at 03/21/2016 12:55 PM EDT ----- Please call patient, same coumadin, check PT 2 months

## 2016-03-22 ENCOUNTER — Telehealth: Payer: Self-pay | Admitting: Oncology

## 2016-03-22 NOTE — Telephone Encounter (Signed)
lvm for pt regarding to June lab.[name] [name]

## 2016-03-23 ENCOUNTER — Other Ambulatory Visit: Payer: No Typology Code available for payment source

## 2016-05-18 ENCOUNTER — Telehealth: Payer: Self-pay | Admitting: Oncology

## 2016-05-18 NOTE — Telephone Encounter (Signed)
pt called to r/s labs...pt ok and aware

## 2016-05-19 ENCOUNTER — Other Ambulatory Visit (HOSPITAL_BASED_OUTPATIENT_CLINIC_OR_DEPARTMENT_OTHER): Payer: No Typology Code available for payment source

## 2016-05-19 ENCOUNTER — Telehealth: Payer: Self-pay | Admitting: *Deleted

## 2016-05-19 DIAGNOSIS — D500 Iron deficiency anemia secondary to blood loss (chronic): Secondary | ICD-10-CM

## 2016-05-19 DIAGNOSIS — I809 Phlebitis and thrombophlebitis of unspecified site: Secondary | ICD-10-CM

## 2016-05-19 DIAGNOSIS — D509 Iron deficiency anemia, unspecified: Secondary | ICD-10-CM

## 2016-05-19 LAB — PROTIME-INR
INR: 3.4 (ref 2.00–3.50)
Protime: 40.8 Seconds — ABNORMAL HIGH (ref 10.6–13.4)

## 2016-05-19 NOTE — Telephone Encounter (Signed)
Return call received from patient stating that she received message regarding Coumadin.  Patient states that her fingers feel like a vein "pops or bursts" and causes bruising at times and would like to know if Coumadin is the cause of this.  Will notify Dr. Benay Spice of patients concerns.

## 2016-05-19 NOTE — Telephone Encounter (Signed)
Message left for patient to continue Coumadin 5 mg on Monday, Wednesday and Friday and 7.5 mg on Tuesday, Thursday, Saturday and Sunday and for lab check in one month per Dr. Benay Spice.  Asked patient to please call [place] back to confirm that this message was received.

## 2016-05-20 ENCOUNTER — Other Ambulatory Visit: Payer: No Typology Code available for payment source

## 2016-05-20 ENCOUNTER — Telehealth: Payer: Self-pay | Admitting: Oncology

## 2016-05-20 NOTE — Telephone Encounter (Signed)
[name] [name] and advise don 6.12 appt.Marland KitchenMarland KitchenMarland KitchenMarland Kitchenpt ok and aware

## 2016-05-20 NOTE — Telephone Encounter (Addendum)
Per Dr. Benay Spice: Could be related to Coumadin. Call office for bleeding or bruising at other sites. Check PT/INR 6/12 or 6/13. Pt made aware. She requests AM appt. Order to schedulers.

## 2016-05-23 ENCOUNTER — Telehealth: Payer: Self-pay | Admitting: Oncology

## 2016-05-23 ENCOUNTER — Telehealth: Payer: Self-pay | Admitting: *Deleted

## 2016-05-23 ENCOUNTER — Other Ambulatory Visit: Payer: No Typology Code available for payment source

## 2016-05-23 ENCOUNTER — Other Ambulatory Visit: Payer: Self-pay | Admitting: *Deleted

## 2016-05-23 DIAGNOSIS — I809 Phlebitis and thrombophlebitis of unspecified site: Secondary | ICD-10-CM

## 2016-05-23 DIAGNOSIS — D509 Iron deficiency anemia, unspecified: Secondary | ICD-10-CM

## 2016-05-23 NOTE — Telephone Encounter (Signed)
left msg confirming 6/13 apt

## 2016-05-23 NOTE — Telephone Encounter (Signed)
Pt here this morning for PT/INR, but told lab that was not the lab work that needed to be done.  Pt left [place] and told lab that she needed to get to work without lab being drawn.  Call placed to patient to inform her that Dr. Benay Spice wants a PT/INR done tomorrow.  Patient verbalized an understanding of information and will be here for 8:15AM appointment tomorrow.

## 2016-05-24 ENCOUNTER — Telehealth: Payer: Self-pay | Admitting: *Deleted

## 2016-05-24 ENCOUNTER — Other Ambulatory Visit (HOSPITAL_BASED_OUTPATIENT_CLINIC_OR_DEPARTMENT_OTHER): Payer: No Typology Code available for payment source

## 2016-05-24 DIAGNOSIS — I809 Phlebitis and thrombophlebitis of unspecified site: Secondary | ICD-10-CM

## 2016-05-24 LAB — PROTIME-INR
INR: 3.1 (ref 2.00–3.50)
Protime: 37.2 Seconds — ABNORMAL HIGH (ref 10.6–13.4)

## 2016-05-24 NOTE — Telephone Encounter (Signed)
Left message on voicemail for pt to continue same dose of Coumadin. Recheck lab in one month, per Dr. Benay Spice. (Pt takes 5 mg every Mon, Wed and Fri. 2.5 mg all other days) Order to [facility].

## 2016-05-25 ENCOUNTER — Telehealth: Payer: Self-pay | Admitting: *Deleted

## 2016-05-25 ENCOUNTER — Telehealth: Payer: Self-pay | Admitting: Oncology

## 2016-05-25 NOTE — Telephone Encounter (Signed)
s.w. pt and advised on July appt....pt ok and aware °

## 2016-05-25 NOTE — Telephone Encounter (Signed)
Call from pt asking what can be done about the bruising in her fingers. She reports she feels a stinging sensation after picking up a broom or mop, later finds a bruise. It has not happened lately. Informed her it was likely related to Coumadin, INR was higher last week which would mean increased risk for bruising/bleeding. Per Dr. Benay Spice: Call office for bleeding or bruising at other sites. Pt aware. She will return for lab in one month.

## 2016-06-22 ENCOUNTER — Other Ambulatory Visit (HOSPITAL_BASED_OUTPATIENT_CLINIC_OR_DEPARTMENT_OTHER): Payer: No Typology Code available for payment source

## 2016-06-22 ENCOUNTER — Telehealth: Payer: Self-pay | Admitting: *Deleted

## 2016-06-22 ENCOUNTER — Other Ambulatory Visit: Payer: Self-pay | Admitting: Nurse Practitioner

## 2016-06-22 ENCOUNTER — Other Ambulatory Visit: Payer: Self-pay | Admitting: *Deleted

## 2016-06-22 DIAGNOSIS — I809 Phlebitis and thrombophlebitis of unspecified site: Secondary | ICD-10-CM

## 2016-06-22 LAB — PROTIME-INR
INR: 2.8 (ref 2.00–3.50)
Protime: 33.6 Seconds — ABNORMAL HIGH (ref 10.6–13.4)

## 2016-06-22 NOTE — Telephone Encounter (Signed)
Per Dr. Benay Spice, pt notified to continue current dose of Coumadin, 5 mg on Monday, Wednesday, Friday and 2.5 mg Tuesday, Thursday, Saturday and Sunday.  Dose confirmed with patient.  Pt informed that scheduling will be notifying her regarding repeat lab in 1 month.  Pt has no questions or concerns at this time.

## 2016-06-28 ENCOUNTER — Telehealth: Payer: Self-pay | Admitting: Oncology

## 2016-06-28 NOTE — Telephone Encounter (Signed)
Added lab for 8/9. Left message and mailed scheduled.

## 2016-07-20 ENCOUNTER — Telehealth: Payer: Self-pay | Admitting: *Deleted

## 2016-07-20 ENCOUNTER — Other Ambulatory Visit (HOSPITAL_BASED_OUTPATIENT_CLINIC_OR_DEPARTMENT_OTHER): Payer: No Typology Code available for payment source

## 2016-07-20 DIAGNOSIS — I809 Phlebitis and thrombophlebitis of unspecified site: Secondary | ICD-10-CM

## 2016-07-20 DIAGNOSIS — D509 Iron deficiency anemia, unspecified: Secondary | ICD-10-CM

## 2016-07-20 LAB — PROTIME-INR
INR: 2.7 (ref 2.00–3.50)
Protime: 32.4 Seconds — ABNORMAL HIGH (ref 10.6–13.4)

## 2016-07-20 NOTE — Telephone Encounter (Signed)
Per Dr. Benay Spice, message left for pt to instruct her to continue same coumadin dose and that PT will be checked in one month.  Instructed pt to call [place] back to confirm that she received this message.

## 2016-07-20 NOTE — Telephone Encounter (Signed)
-----   Message from Ladell Pier, MD sent at 07/20/2016  4:05 PM EDT ----- Please call patient, same coumadin, check PT 1 month

## 2016-08-10 ENCOUNTER — Other Ambulatory Visit: Payer: No Typology Code available for payment source

## 2016-08-16 ENCOUNTER — Ambulatory Visit (HOSPITAL_BASED_OUTPATIENT_CLINIC_OR_DEPARTMENT_OTHER): Payer: No Typology Code available for payment source | Admitting: Nurse Practitioner

## 2016-08-16 ENCOUNTER — Ambulatory Visit (HOSPITAL_BASED_OUTPATIENT_CLINIC_OR_DEPARTMENT_OTHER): Payer: No Typology Code available for payment source

## 2016-08-16 ENCOUNTER — Telehealth: Payer: Self-pay | Admitting: Oncology

## 2016-08-16 ENCOUNTER — Other Ambulatory Visit: Payer: No Typology Code available for payment source

## 2016-08-16 VITALS — BP 156/72 | HR 65 | Temp 98.0°F | Resp 18 | Ht 67.0 in | Wt 238.6 lb

## 2016-08-16 DIAGNOSIS — Z7901 Long term (current) use of anticoagulants: Secondary | ICD-10-CM

## 2016-08-16 DIAGNOSIS — M79604 Pain in right leg: Secondary | ICD-10-CM | POA: Diagnosis not present

## 2016-08-16 DIAGNOSIS — M79605 Pain in left leg: Secondary | ICD-10-CM

## 2016-08-16 DIAGNOSIS — I809 Phlebitis and thrombophlebitis of unspecified site: Secondary | ICD-10-CM

## 2016-08-16 DIAGNOSIS — Z86718 Personal history of other venous thrombosis and embolism: Secondary | ICD-10-CM | POA: Diagnosis not present

## 2016-08-16 DIAGNOSIS — D509 Iron deficiency anemia, unspecified: Secondary | ICD-10-CM

## 2016-08-16 DIAGNOSIS — Z862 Personal history of diseases of the blood and blood-forming organs and certain disorders involving the immune mechanism: Secondary | ICD-10-CM

## 2016-08-16 DIAGNOSIS — I82402 Acute embolism and thrombosis of unspecified deep veins of left lower extremity: Secondary | ICD-10-CM

## 2016-08-16 LAB — PROTIME-INR
INR: 2.7 (ref 2.00–3.50)
Protime: 32.4 Seconds — ABNORMAL HIGH (ref 10.6–13.4)

## 2016-08-16 NOTE — Telephone Encounter (Signed)
Patient sent back to lab and given avs report and appointments for October thru September 2018.

## 2016-08-16 NOTE — Progress Notes (Signed)
  [facility] OFFICE PROGRESS NOTE   Diagnosis:  Venous thrombosis  INTERVAL HISTORY:   [name] [name] returns as scheduled. She continues Coumadin, current dose 7.5 mg Monday Wednesday Friday and 5 mg all other days. She denies any bleeding. No signs/symptoms of thrombosis.  Objective:  Vital signs in last 24 hours:  Blood pressure (!) 156/72, pulse 65, temperature 98 F (36.7 C), temperature source Oral, resp. rate 18, height 5\' 7"  (1.702 m), weight 238 lb 9.6 oz (108.2 kg), SpO2 99 %.     Resp: Lungs clear bilaterally. Cardio: Regular rate and rhythm. GI: No organomegaly. Vascular: No leg edema.   Lab Results:  Lab Results  Component Value Date   WBC 9.0 01/07/2016   HGB 12.7 01/07/2016   HCT 39.9 01/07/2016   MCV 92.6 01/07/2016   PLT 367 01/07/2016   NEUTROABS 2.9 08/14/2015    Imaging:  No results found.  Medications: I have reviewed the patient's current medications.  Assessment/Plan: 1. 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 2. 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 Repeat lupus and anticardiolipin antibody testing was negative until there was a positive lupus anticoagulant panel on 05/27/2008. There was a positive lupus anticoagulant and hexagonal phase study on 10/27/2009. She would like to continue Coumadin anticoagulation.  3. Anemia secondary to menorrhagia and iron deficiency. The hemoglobin was normal in February of 2015 and she is now amenorrheic. Hemoglobin normal 08/14/2015. 4. Chronic bilateral leg pain.   Disposition:[name] [name] appears stable. She will continue Coumadin at the current dose. We will follow-up on the PT/INR from today. Plan to continue monthly labs. She will return for a follow-up visit in one year.    Ned Card ANP/GNP-BC   08/16/2016  9:43 AM

## 2016-08-17 ENCOUNTER — Telehealth: Payer: Self-pay

## 2016-08-17 NOTE — Telephone Encounter (Signed)
Called patient to confirm coumadin dosage. Pt states she is taking 5mg  M/W/F and 7.5mg  T/Th/Sat/Sun  Informed pt PT / INR was good and continue taking same dosage per Marlynn Perking

## 2016-09-14 ENCOUNTER — Telehealth: Payer: Self-pay | Admitting: *Deleted

## 2016-09-14 ENCOUNTER — Other Ambulatory Visit (HOSPITAL_BASED_OUTPATIENT_CLINIC_OR_DEPARTMENT_OTHER): Payer: No Typology Code available for payment source

## 2016-09-14 DIAGNOSIS — I809 Phlebitis and thrombophlebitis of unspecified site: Secondary | ICD-10-CM

## 2016-09-14 DIAGNOSIS — D509 Iron deficiency anemia, unspecified: Secondary | ICD-10-CM

## 2016-09-14 DIAGNOSIS — I82402 Acute embolism and thrombosis of unspecified deep veins of left lower extremity: Secondary | ICD-10-CM

## 2016-09-14 LAB — CBC WITH DIFFERENTIAL/PLATELET
BASO%: 0.4 % (ref 0.0–2.0)
Basophils Absolute: 0 10*3/uL (ref 0.0–0.1)
EOS%: 5.2 % (ref 0.0–7.0)
Eosinophils Absolute: 0.4 10*3/uL (ref 0.0–0.5)
HCT: 36.3 % (ref 34.8–46.6)
HGB: 12.1 g/dL (ref 11.6–15.9)
LYMPH%: 32.8 % (ref 14.0–49.7)
MCH: 29.6 pg (ref 25.1–34.0)
MCHC: 33.3 g/dL (ref 31.5–36.0)
MCV: 88.8 fL (ref 79.5–101.0)
MONO#: 0.5 10*3/uL (ref 0.1–0.9)
MONO%: 7.7 % (ref 0.0–14.0)
NEUT#: 3.7 10*3/uL (ref 1.5–6.5)
NEUT%: 53.9 % (ref 38.4–76.8)
Platelets: 292 10*3/uL (ref 145–400)
RBC: 4.09 10*6/uL (ref 3.70–5.45)
RDW: 13.6 % (ref 11.2–14.5)
WBC: 6.9 10*3/uL (ref 3.9–10.3)
lymph#: 2.3 10*3/uL (ref 0.9–3.3)
nRBC: 0 % (ref 0–0)

## 2016-09-14 LAB — PROTIME-INR
INR: 3.5 (ref 2.00–3.50)
Protime: 42 Seconds — ABNORMAL HIGH (ref 10.6–13.4)

## 2016-09-14 NOTE — Addendum Note (Signed)
Addended by: Brien Few on: 09/14/2016 03:52 PM   Modules accepted: Orders

## 2016-09-14 NOTE — Telephone Encounter (Signed)
Left message on voicemail for pt to call office. INR 3.5 Has she had any medication or dietary changes? Lab reviewed by Dr. Benay Spice: Recheck INR in one week. Order to schedulers.

## 2016-09-16 ENCOUNTER — Telehealth: Payer: Self-pay | Admitting: *Deleted

## 2016-09-16 DIAGNOSIS — I809 Phlebitis and thrombophlebitis of unspecified site: Secondary | ICD-10-CM

## 2016-09-16 NOTE — Telephone Encounter (Signed)
Message received from patient stating that she is returning call from Wednesday regarding her medications.  Patient states that she has not had any medication or dietary changes.  Dr. Benay Spice informed.  Call placed back to patient to confirm Coumadin dose at 5 mg on Monday, Wednesday and Friday and 7.5 mg on Tuesday, Thursday, Saturday and Sunday.  Pt instructed that schedulers will contact her regarding lab appt needed for 09/21/16.  Pt instructed to continue same dose of Coumadin per Dr. Benay Spice.  Pt appreciative of call back and has no questions or concerns at this time.

## 2016-09-18 ENCOUNTER — Telehealth: Payer: Self-pay | Admitting: Oncology

## 2016-09-18 NOTE — Telephone Encounter (Signed)
S/w pt, gave appt for 10/11 @ 7.45am.

## 2016-09-21 ENCOUNTER — Telehealth: Payer: Self-pay | Admitting: Oncology

## 2016-09-21 ENCOUNTER — Telehealth: Payer: Self-pay | Admitting: *Deleted

## 2016-09-21 ENCOUNTER — Other Ambulatory Visit (HOSPITAL_BASED_OUTPATIENT_CLINIC_OR_DEPARTMENT_OTHER): Payer: No Typology Code available for payment source

## 2016-09-21 DIAGNOSIS — I809 Phlebitis and thrombophlebitis of unspecified site: Secondary | ICD-10-CM

## 2016-09-21 LAB — PROTIME-INR
INR: 4.3 — ABNORMAL HIGH (ref 2.00–3.50)
Protime: 51.6 Seconds — ABNORMAL HIGH (ref 10.6–13.4)

## 2016-09-21 NOTE — Telephone Encounter (Signed)
INR 4.3, pt denies any new medications or dietary changes. Per Dr. Benay Spice: HOLD Coumadin 10/11 and 10/12. Check INR on 10/13. Left message on voicemail with these instructions. Requested she call to confirm.

## 2016-09-21 NOTE — Telephone Encounter (Signed)
10/13 Appointment scheduled per MD. note

## 2016-09-23 ENCOUNTER — Other Ambulatory Visit (HOSPITAL_BASED_OUTPATIENT_CLINIC_OR_DEPARTMENT_OTHER): Payer: No Typology Code available for payment source

## 2016-09-23 DIAGNOSIS — I809 Phlebitis and thrombophlebitis of unspecified site: Secondary | ICD-10-CM

## 2016-09-23 LAB — PROTIME-INR
INR: 2 (ref 2.00–3.50)
Protime: 24 Seconds — ABNORMAL HIGH (ref 10.6–13.4)

## 2016-09-23 MED ORDER — WARFARIN SODIUM 5 MG PO TABS: 5.0000 mg | ORAL_TABLET | ORAL | 11 refills | Status: DC

## 2016-09-23 NOTE — Telephone Encounter (Signed)
INR reviewed by Dr. Benay Spice: Change Coumadin dose to 5mg  MWFS 7.5mg  T,TH,Sun. Called pt with these instructions, she wrote them down and read them back. Next lab 10/31.   Pt requested jury duty excuse letter. "My leg falls asleep if I sit too long." Per Dr. Benay Spice: Not related to history of DVT or Coumadin therapy. Informed pt Dr. Benay Spice will not write letter. She voiced understanding.

## 2016-10-11 ENCOUNTER — Other Ambulatory Visit: Payer: No Typology Code available for payment source

## 2016-10-13 ENCOUNTER — Other Ambulatory Visit (HOSPITAL_BASED_OUTPATIENT_CLINIC_OR_DEPARTMENT_OTHER): Payer: No Typology Code available for payment source

## 2016-10-13 DIAGNOSIS — I82402 Acute embolism and thrombosis of unspecified deep veins of left lower extremity: Secondary | ICD-10-CM

## 2016-10-13 DIAGNOSIS — I809 Phlebitis and thrombophlebitis of unspecified site: Secondary | ICD-10-CM

## 2016-10-13 LAB — PROTIME-INR
INR: 2.9 (ref 2.00–3.50)
Protime: 34.8 Seconds — ABNORMAL HIGH (ref 10.6–13.4)

## 2016-10-17 ENCOUNTER — Telehealth: Payer: Self-pay | Admitting: *Deleted

## 2016-10-17 ENCOUNTER — Other Ambulatory Visit: Payer: Self-pay | Admitting: *Deleted

## 2016-10-17 DIAGNOSIS — I809 Phlebitis and thrombophlebitis of unspecified site: Secondary | ICD-10-CM

## 2016-10-17 NOTE — Telephone Encounter (Signed)
Attempted to call patient regarding Coumadin dose.  No answer.  Patient to continue Coumadin 5 mg on Monday, Wednesday, Friday and Saturday alternating with 7.5 mg on Tuesday, Thursday and Sunday per Dr. Benay Spice.  Will attempt to contact patient again tomorrow.

## 2016-10-17 NOTE — Telephone Encounter (Signed)
"  I had lab work 10-13-2016 and have not received a call with results or instructions.  No new medications, diet changes or missed doses.  What do I need to do about the coumadin.  Currently take 5 mg alternating with 7.5 mg on Tues., Thurs., Sun."  This message left on voicemail ext [phone] 3:07pm.

## 2016-10-18 ENCOUNTER — Other Ambulatory Visit: Payer: Self-pay | Admitting: Oncology

## 2016-10-18 ENCOUNTER — Telehealth: Payer: Self-pay | Admitting: *Deleted

## 2016-10-18 NOTE — Telephone Encounter (Signed)
Per Dr. Benay Spice, patient notified to continue Coumadin dose as currently taking and that we will recheck PT/INR in 1 month.  Pt states that she is taking Coumadin 5 mg on Monday, Weds, Friday and Saturday and 7.5 mg on Tues, Thurs and Sunday.  Patient has no questions at this time and is appreciative of call.

## 2016-11-08 ENCOUNTER — Other Ambulatory Visit: Payer: No Typology Code available for payment source

## 2016-11-09 ENCOUNTER — Telehealth: Payer: Self-pay | Admitting: Oncology

## 2016-11-09 NOTE — Telephone Encounter (Signed)
Patient called to have 11/28 Lab appointment rescheduled for Friday 12/1 @10AM .

## 2016-11-11 ENCOUNTER — Telehealth: Payer: Self-pay | Admitting: *Deleted

## 2016-11-11 ENCOUNTER — Other Ambulatory Visit (HOSPITAL_BASED_OUTPATIENT_CLINIC_OR_DEPARTMENT_OTHER): Payer: No Typology Code available for payment source

## 2016-11-11 DIAGNOSIS — I82402 Acute embolism and thrombosis of unspecified deep veins of left lower extremity: Secondary | ICD-10-CM | POA: Diagnosis not present

## 2016-11-11 DIAGNOSIS — I809 Phlebitis and thrombophlebitis of unspecified site: Secondary | ICD-10-CM

## 2016-11-11 LAB — PROTIME-INR
INR: 3.8 — ABNORMAL HIGH (ref 2.00–3.50)
Protime: 45.6 s — ABNORMAL HIGH (ref 10.6–13.4)

## 2016-11-11 NOTE — Telephone Encounter (Signed)
Patient notified per order of Dr. Benay Spice to hold Coumadin on 11/11/16 and 11/12/16, to resume Coumadin on Sunday, 11/13/16 at 5 mg daily and that we will recheck PT in 2 weeks.  Teach back done.  Patient appreciative of call and has no questions at this time.

## 2016-11-24 ENCOUNTER — Other Ambulatory Visit (HOSPITAL_BASED_OUTPATIENT_CLINIC_OR_DEPARTMENT_OTHER): Payer: No Typology Code available for payment source

## 2016-11-24 ENCOUNTER — Telehealth: Payer: Self-pay | Admitting: Oncology

## 2016-11-24 ENCOUNTER — Telehealth: Payer: Self-pay | Admitting: *Deleted

## 2016-11-24 DIAGNOSIS — I809 Phlebitis and thrombophlebitis of unspecified site: Secondary | ICD-10-CM

## 2016-11-24 LAB — PROTIME-INR
INR: 3.5 (ref 2.00–3.50)
Protime: 42 Seconds — ABNORMAL HIGH (ref 10.6–13.4)

## 2016-11-24 NOTE — Telephone Encounter (Signed)
Patient came to scheduling to have labs rescheduled from 12/15 to 12/14. The patient states that she received a message that she was to have labs on 12/14, the appointment is scheduled for 12/15. Rescheduled labs for the patient.

## 2016-11-24 NOTE — Telephone Encounter (Signed)
INR reviewed by Dr. Benay Spice: Change dose to 2.5 mg every MWF. 5mg  all other days. Repeat lab in 3 weeks. Left message on voicemail with above instructions. Requested pt call to confirm. Message to schedulers to change lab appt.

## 2016-11-25 ENCOUNTER — Other Ambulatory Visit: Payer: No Typology Code available for payment source

## 2016-11-25 MED ORDER — WARFARIN SODIUM 5 MG PO TABS: 5.0000 mg | ORAL_TABLET | Freq: Every day | ORAL | 11 refills | Status: DC

## 2016-11-25 NOTE — Telephone Encounter (Signed)
Pt returned call. Coumadin instructions given. She voiced understanding. She knows to expect call from schedulers with lab appt in 3 weeks.

## 2016-11-28 ENCOUNTER — Telehealth: Payer: Self-pay | Admitting: Oncology

## 2016-11-28 NOTE — Telephone Encounter (Signed)
sw pt to confirm 12/13/16 appt date/time per LOS °

## 2016-12-06 ENCOUNTER — Other Ambulatory Visit: Payer: No Typology Code available for payment source

## 2016-12-13 ENCOUNTER — Other Ambulatory Visit (HOSPITAL_BASED_OUTPATIENT_CLINIC_OR_DEPARTMENT_OTHER): Payer: PRIVATE HEALTH INSURANCE

## 2016-12-13 DIAGNOSIS — I82402 Acute embolism and thrombosis of unspecified deep veins of left lower extremity: Secondary | ICD-10-CM

## 2016-12-13 DIAGNOSIS — I809 Phlebitis and thrombophlebitis of unspecified site: Secondary | ICD-10-CM | POA: Diagnosis not present

## 2016-12-13 LAB — PROTIME-INR
INR: 2.9 (ref 2.00–3.50)
Protime: 34.8 Seconds — ABNORMAL HIGH (ref 10.6–13.4)

## 2016-12-15 ENCOUNTER — Telehealth: Payer: Self-pay

## 2016-12-15 NOTE — Telephone Encounter (Signed)
-----   Message from Owens Shark, NP sent at 12/13/2016  4:24 PM EST ----- Please instruct her to continue the same dose of Coumadin. Repeat PT/INR in 3 weeks.

## 2016-12-15 NOTE — Telephone Encounter (Signed)
Called and informed pt to continue same dose of coumadin and f/u as scheduled 1/23 for repeat PT/INR. Pt verbalized understanding and denies any questions or concerns at this time

## 2017-01-03 ENCOUNTER — Other Ambulatory Visit: Payer: No Typology Code available for payment source

## 2017-01-12 ENCOUNTER — Telehealth: Payer: Self-pay | Admitting: *Deleted

## 2017-01-12 ENCOUNTER — Other Ambulatory Visit: Payer: Self-pay | Admitting: *Deleted

## 2017-01-12 ENCOUNTER — Other Ambulatory Visit (HOSPITAL_BASED_OUTPATIENT_CLINIC_OR_DEPARTMENT_OTHER): Payer: No Typology Code available for payment source

## 2017-01-12 DIAGNOSIS — I809 Phlebitis and thrombophlebitis of unspecified site: Secondary | ICD-10-CM

## 2017-01-12 LAB — PROTIME-INR
INR: 4.1 — ABNORMAL HIGH (ref 2.00–3.50)
Protime: 49.2 Seconds — ABNORMAL HIGH (ref 10.6–13.4)

## 2017-01-12 MED ORDER — WARFARIN SODIUM 5 MG PO TABS: ORAL_TABLET | ORAL | 11 refills | Status: DC

## 2017-01-12 NOTE — Telephone Encounter (Signed)
Call placed to patient to inform her per order of Dr. Benay Spice to hold Coumadin 01/12/17 and 01/13/17 and to change dose to 5 mg on Monday, Wednesday and Friday and to 2.5 mg on Tuesday, Thursday, Saturday and Sunday.  Teach back done and patient verbalizes an understanding of MD instructions.  Pt informed that scheduling will call her to schedule lab appt in 3 weeks per order of Dr. Benay Spice.  Patient appreciative of call.

## 2017-01-31 ENCOUNTER — Other Ambulatory Visit (HOSPITAL_BASED_OUTPATIENT_CLINIC_OR_DEPARTMENT_OTHER): Payer: No Typology Code available for payment source

## 2017-01-31 DIAGNOSIS — I809 Phlebitis and thrombophlebitis of unspecified site: Secondary | ICD-10-CM | POA: Diagnosis not present

## 2017-01-31 LAB — PROTIME-INR
INR: 3.7 — ABNORMAL HIGH (ref 2.00–3.50)
Protime: 44.4 Seconds — ABNORMAL HIGH (ref 10.6–13.4)

## 2017-02-01 ENCOUNTER — Telehealth: Payer: Self-pay | Admitting: *Deleted

## 2017-02-01 NOTE — Telephone Encounter (Signed)
INR reviewed by Dr. Benay Spice: HOLD COUMADIN FOR 2 DAYS, then change dose to 2.5 mg daily with the exception of 5 mg on Wed only. Requested she call office to confirm and let us know if she has had any med or dietary changes.

## 2017-02-03 NOTE — Telephone Encounter (Signed)
Pt did not return call, left another message with Coumadin instructions per 2/20 note.

## 2017-02-28 ENCOUNTER — Other Ambulatory Visit: Payer: No Typology Code available for payment source

## 2017-03-02 ENCOUNTER — Telehealth: Payer: Self-pay | Admitting: *Deleted

## 2017-03-02 ENCOUNTER — Other Ambulatory Visit (HOSPITAL_COMMUNITY)
Admission: RE | Admit: 2017-03-02 | Discharge: 2017-03-02 | Disposition: A | Discharge status: Home / Self Care | Payer: PRIVATE HEALTH INSURANCE | Source: Ambulatory Visit | Attending: Nurse Practitioner | Admitting: Nurse Practitioner

## 2017-03-02 ENCOUNTER — Encounter: Payer: Self-pay | Admitting: *Deleted

## 2017-03-02 ENCOUNTER — Other Ambulatory Visit: Payer: PRIVATE HEALTH INSURANCE

## 2017-03-02 DIAGNOSIS — I82402 Acute embolism and thrombosis of unspecified deep veins of left lower extremity: Secondary | ICD-10-CM

## 2017-03-02 DIAGNOSIS — I809 Phlebitis and thrombophlebitis of unspecified site: Secondary | ICD-10-CM

## 2017-03-02 LAB — PROTIME-INR
INR: 4.19
Prothrombin Time: 41.5 seconds — ABNORMAL HIGH (ref 11.4–15.2)

## 2017-03-02 MED ORDER — WARFARIN SODIUM 5 MG PO TABS: ORAL_TABLET | ORAL | 11 refills | Status: DC

## 2017-03-02 NOTE — Telephone Encounter (Signed)
Message left for patient to hold Coumadin today and tomorrow and to resume at 2.5 mg on Saturday, Sunday, Monday, Tuesday, Thursday and Friday and 5 mg on Wednesday.  Instructed patient to call [place] back to confirm she received this message.

## 2017-03-02 NOTE — Progress Notes (Unsigned)
Critical INR reported to [place] by M. Hadrian Yarbrough at 1210 03/02/17

## 2017-03-03 ENCOUNTER — Telehealth: Payer: Self-pay | Admitting: *Deleted

## 2017-03-03 NOTE — Telephone Encounter (Signed)
Call placed to patient to confirm she received instructions regarding Coumadin yesterday.  No answer, will call back later.

## 2017-03-03 NOTE — Telephone Encounter (Signed)
Call received from patient to confirm dose of Coumadin.  Patient verbalized that she is to hold Coumadin yesterday and today and to take Coumadin 2.5 mg every day except for 5 mg on Wednesday.  Teach back done.  Patient has no questions at this time.

## 2017-03-25 ENCOUNTER — Emergency Department (HOSPITAL_COMMUNITY): Payer: PRIVATE HEALTH INSURANCE

## 2017-03-25 ENCOUNTER — Emergency Department (HOSPITAL_COMMUNITY)
Admission: EM | Admit: 2017-03-25 | Discharge: 2017-03-25 | Disposition: A | Discharge status: Home / Self Care | Payer: PRIVATE HEALTH INSURANCE | Attending: Emergency Medicine | Admitting: Emergency Medicine

## 2017-03-25 ENCOUNTER — Encounter (HOSPITAL_COMMUNITY): Payer: Self-pay

## 2017-03-25 DIAGNOSIS — Z87891 Personal history of nicotine dependence: Secondary | ICD-10-CM | POA: Diagnosis not present

## 2017-03-25 DIAGNOSIS — Y9389 Activity, other specified: Secondary | ICD-10-CM | POA: Insufficient documentation

## 2017-03-25 DIAGNOSIS — Z23 Encounter for immunization: Secondary | ICD-10-CM | POA: Insufficient documentation

## 2017-03-25 DIAGNOSIS — Y929 Unspecified place or not applicable: Secondary | ICD-10-CM | POA: Insufficient documentation

## 2017-03-25 DIAGNOSIS — S0101XA Laceration without foreign body of scalp, initial encounter: Secondary | ICD-10-CM

## 2017-03-25 DIAGNOSIS — Y999 Unspecified external cause status: Secondary | ICD-10-CM | POA: Insufficient documentation

## 2017-03-25 DIAGNOSIS — W1839XA Other fall on same level, initial encounter: Secondary | ICD-10-CM | POA: Diagnosis not present

## 2017-03-25 DIAGNOSIS — W19XXXA Unspecified fall, initial encounter: Secondary | ICD-10-CM

## 2017-03-25 MED ORDER — LIDOCAINE-EPINEPHRINE (PF) 2 %-1:200000 IJ SOLN
10.0000 mL | Freq: Once | INTRAMUSCULAR | Status: AC
  Administered 2017-03-25: 10 mL
  Filled 2017-03-25: qty 20

## 2017-03-25 MED ORDER — TETANUS-DIPHTH-ACELL PERTUSSIS 5-2.5-18.5 LF-MCG/0.5 IM SUSP
0.5000 mL | Freq: Once | INTRAMUSCULAR | Status: AC
  Administered 2017-03-25: 0.5 mL via INTRAMUSCULAR
  Filled 2017-03-25: qty 0.5

## 2017-03-25 NOTE — ED Triage Notes (Signed)
She states she was thrown off balance whilst taking trash; thereby lac. Top of forehead area (well into hairline). She denies l.o.c. And is in no distress.

## 2017-03-25 NOTE — ED Provider Notes (Signed)
[facility] DEPT Provider Note   CSN: [id number] Arrival date & time: 03/25/17  1347   By signing my name below, I, Eunice Blase, attest that this documentation has been prepared under the direction and in the presence of Carmon Sails, PA-C. Electronically signed, Eunice Blase, ED Scribe. 03/25/17. 4:53 PM.   History   Chief Complaint Chief Complaint  Patient presents with  . Head Laceration   The history is provided by the patient and medical records. No language interpreter was used.    [name] [name] is a 56 y.o. female with h/o DVT and anticoagulant use (coumadin), transported via family to the Emergency Department with laceration to the top R of the scalp s/p sustaining a blow to it from a trash can. Patient notes she was trying to get something out of her trashcan when she lost balance and fell into the trash can and the lid of the trashcan hit her on the crown of the scalp. Denies LOC and states she was ambulatory following Pt followed by Dr. Benay Spice for h/o DVT, last had her coumadin levels checked 02/28/2017 and notes upcoming appointment for coumadin level check in 2 days. No noted headache, visual changes, nausea, vomiting, numbness, weakness or any other complaints at this time.   Past Medical History:  Diagnosis Date  . DVT (deep venous thrombosis) ([place])    left leg    Patient Active Problem List   Diagnosis Date Noted  . Paronychia 10/31/2014  . Iron deficiency anemia 10/24/2011  . Phlebitis and thrombophlebitis 10/24/2011    Past Surgical History:  Procedure Laterality Date  . SPINE SURGERY      OB History    No data available       Home Medications    Prior to Admission medications   Medication Sig Start Date End Date Taking? Authorizing Provider  acetaminophen (TYLENOL) 325 MG tablet Take 650 mg by mouth every 6 (six) hours as needed.      Historical Provider, MD  diphenhydramine-acetaminophen (TYLENOL PM) 25-500 MG TABS tablet Take 1  tablet by mouth at bedtime as needed (sleep).    Historical Provider, MD  oxyCODONE-acetaminophen (PERCOCET) 5-325 MG tablet Take 1 tablet by mouth every 4 (four) hours as needed for moderate pain. 6/83/41   Delora Fuel, MD  warfarin (COUMADIN) 5 MG tablet 03/02/17-5 mg on Wednesday and 2.5 mg all other days 03/02/17   Ladell Pier, MD    Family History No family history on file.  Social History Social History  Substance Use Topics  . Smoking status: Former Research scientist (life sciences)  . Smokeless tobacco: Not on file  . Alcohol use Not on file     Allergies   Patient has no known allergies.   Review of Systems Review of Systems  HENT: Negative for facial swelling.   Gastrointestinal: Negative for nausea and vomiting.  Musculoskeletal: Negative for gait problem.  Skin: Positive for wound.  Neurological: Negative for syncope, weakness, numbness and headaches.  Hematological: Bruises/bleeds easily.  All other systems reviewed and are negative.    Physical Exam Updated Vital Signs BP (!) 162/81 (BP Location: Left Arm)   Pulse 80   Temp 98.1 F (36.7 C) (Oral)   Resp 16   Ht 5\' 7"  (1.702 m)   SpO2 95%   Physical Exam  Constitutional: She is oriented to person, place, and time. She appears well-developed and well-nourished.  HENT:  Head: Normocephalic and atraumatic.  No facial, nasal or skull bone tenderness No raccoon's eyes.  No battle signs. No periorbital erythema, edema, ecchymosis or signs of injury.  Eyes: Conjunctivae and EOM are normal. Pupils are equal, round, and reactive to light. Right eye exhibits no discharge. Left eye exhibits no discharge. No scleral icterus.  Neck: Normal range of motion. No JVD present. No tracheal deviation present.  No cervical spinous process tenderness. No cervical paraspinal muscular tenderness. Full active range of motion of cervical spine.  Pulmonary/Chest: Effort normal. No stridor.  Neurological: She is alert and oriented to person, place, and  time. No cranial nerve deficit or sensory deficit. She exhibits normal muscle tone. Coordination normal.  Pt is alert and oriented.   Speech and phonation normal.   Thought process coherent.   Strength 5/5 in upper and lower extremities.   Sensation to light touch intact in upper and lower extremities.  Gait normal.   Negative Romberg. No leg drift.  Intact finger to nose test. CN I not tested CN II full visual fields  CN III, IV, VI PEERL and EOM intact CN V light touch intact in all 3 divisions of trigeminal nerve CN VII facial nerve movements intact, symmetric CN VIII hearing intact to finger rub CN IX, X no uvula deviation, symmetric soft palate rise CN XI 5/5 SCM and trapezius strength  CN XII Tongue midline with symmetric L/R movement  Skin: Skin is warm and dry. Laceration noted.  5 cm laceration to the top center of scalp, without surrounding ecchymosis, bleeding or discharge  Psychiatric: She has a normal mood and affect. Her behavior is normal. Judgment and thought content normal.  Nursing note and vitals reviewed.   ED Treatments / Results  DIAGNOSTIC STUDIES: Oxygen Saturation is 95% on RA, adequate by my interpretation.    COORDINATION OF CARE: 4:34 PM Discussed treatment plan with pt at bedside and pt agreed to plan. Pt prepared for laceration repair.  Labs (all labs ordered are listed, but only abnormal results are displayed) Labs Reviewed - No data to display  EKG  EKG Interpretation None       Radiology Ct Head Wo Contrast  Result Date: 03/25/2017 CLINICAL DATA:  56 year old female with fall and head injury on Coumadin. EXAM: CT HEAD WITHOUT CONTRAST TECHNIQUE: Contiguous axial images were obtained from the base of the skull through the vertex without intravenous contrast. COMPARISON:  None. FINDINGS: Brain: No evidence of infarction, hemorrhage, hydrocephalus, extra-axial collection or mass lesion/mass effect. Vascular: No hyperdense vessel or unexpected  calcification. Skull: Normal. Negative for fracture or focal lesion. Sinuses/Orbits: No acute finding. Other: Soft tissue injury changes of the high scalp noted. IMPRESSION: No evidence of intracranial abnormality. Scalp soft tissue injury without fracture. Electronically Signed   By: Margarette Canada M.D.   On: 03/25/2017 17:29    Procedures .Marland KitchenLaceration Repair Date/Time: 03/25/2017 6:29 PM Performed by: Kinnie Feil Authorized by: Kinnie Feil   Consent:    Consent obtained:  Verbal   Consent given by:  Patient   Risks discussed:  Infection, pain and poor cosmetic result   Alternatives discussed:  No treatment and observation Anesthesia (see MAR for exact dosages):    Anesthesia method:  Local infiltration   Local anesthetic:  Lidocaine 2% WITH epi Laceration details:    Location:  Scalp   Scalp location:  Crown   Length (cm):  5 Repair type:    Repair type:  Simple Pre-procedure details:    Preparation:  Patient was prepped and draped in usual sterile fashion Exploration:  Hemostasis achieved with:  Epinephrine and direct pressure   Wound exploration: entire depth of wound probed and visualized     Contaminated: no   Treatment:    Area cleansed with:  Betadine and saline   Amount of cleaning:  Standard   Irrigation solution:  Sterile saline   Irrigation method:  Tap   Visualized foreign bodies/material removed: no   Skin repair:    Repair method:  Staples   Number of staples:  5 Approximation:    Approximation:  Close   Vermilion border: well-aligned   Post-procedure details:    Dressing:  Open (no dressing)   Patient tolerance of procedure:  Tolerated well, no immediate complications   (including critical care time)  Medications Ordered in ED Medications  Tdap (BOOSTRIX) injection 0.5 mL (0.5 mLs Intramuscular Given 03/25/17 1705)  lidocaine-EPINEPHrine (XYLOCAINE W/EPI) 2 %-1:200000 (PF) injection 10 mL (10 mLs Infiltration Given by Other 03/25/17 1730)      Initial Impression / Assessment and Plan / ED Course  I have reviewed the triage vital signs and the nursing notes.  Pertinent labs & imaging results that were available during my care of the patient were reviewed by me and considered in my medical decision making (see chart for details).  Clinical Course as of Mar 25 1833  Sat Mar 25, 2017  1742 IMPRESSION: No evidence of intracranial abnormality.  Scalp soft tissue injury without fracture. CT Head Wo Contrast [CG]    Clinical Course User Index [CG] Kinnie Feil, PA-C    56 year old female presents with 5 cm laceration to the top of her scalp sustained immediately PTA. Mechanical fall. She denies sudden onset, thunderclap headache, vision changes, nausea, vomiting, neck pain. Patient denies fall, no LOC.  Complete neurological exam is normal today. Signs of skull or facial bone compromise. Patient has history of DVT and is on Coumadin. Has upcoming appointment to check her Coumadin levels in 2 days. Tetanus updated in ED.  Staple used to repair laceration. Laceration occurred < 12 hours prior to repair. CT scan obtained, did not show evidence of intracranial abnormality.  Cervical spine cleared with Nexxus criteria. Discussed laceration care with pt and answered questions. Pt to f-u for staple removal in 7 days with PCP and wound check sooner should there be signs of dehiscence or infection. Pt is hemodynamically stable with no complaints prior to dc.     Final Clinical Impressions(s) / ED Diagnoses   Final diagnoses:  Laceration of scalp, initial encounter  Fall, initial encounter    New Prescriptions Discharge Medication List as of 03/25/2017  5:52 PM     I personally performed the services described in this documentation, which was scribed in my presence. The recorded information has been reviewed and is accurate.   Kinnie Feil, PA-C 03/25/17 Velta Addison    Virgel Manifold, MD 03/25/17 [phone]

## 2017-03-25 NOTE — Discharge Instructions (Signed)
Your CT scan did not show any intracranial bleed or skull injuries.  Please contact her primary care provider and make an appointment to get your staples out within 5-8 days, preferably 7 days.  Monitor for signs of wound infection or separation. Return to the emergency Department if he notices increase pain, swelling, bleeding, yellow discharge or fevers.  Avoid getting your wound wet for the next 48 hours. After 48 hours you may rinse your wound with clean water and mild soap. Avoid scrubbing or scratching. You may take ibuprofen or Tylenol for pain.

## 2017-03-27 ENCOUNTER — Ambulatory Visit (HOSPITAL_BASED_OUTPATIENT_CLINIC_OR_DEPARTMENT_OTHER): Payer: PRIVATE HEALTH INSURANCE

## 2017-03-27 ENCOUNTER — Telehealth: Payer: Self-pay | Admitting: *Deleted

## 2017-03-27 DIAGNOSIS — I809 Phlebitis and thrombophlebitis of unspecified site: Secondary | ICD-10-CM

## 2017-03-27 DIAGNOSIS — I82402 Acute embolism and thrombosis of unspecified deep veins of left lower extremity: Secondary | ICD-10-CM

## 2017-03-27 LAB — PROTIME-INR
INR: 1 — ABNORMAL LOW (ref 2.00–3.50)
Protime: 12 Seconds (ref 10.6–13.4)

## 2017-03-27 MED ORDER — WARFARIN SODIUM 5 MG PO TABS: ORAL_TABLET | ORAL | 11 refills | Status: DC

## 2017-03-27 NOTE — Telephone Encounter (Signed)
Call placed to patient to inform her per order of Dr. Benay Spice to take Coumadin 2.5 mg every day except for Monday and Thursday when she is to take 5 mg and we will recheck her labs in 3 weeks.  Teach back done.  Patient appreciative of call and has no questions at this time.

## 2017-03-28 ENCOUNTER — Other Ambulatory Visit: Payer: No Typology Code available for payment source

## 2017-03-28 ENCOUNTER — Telehealth: Payer: Self-pay | Admitting: Oncology

## 2017-03-28 NOTE — Telephone Encounter (Signed)
Called patient to inform her of next scheduled lab appointment for 5.7.18 .

## 2017-04-17 ENCOUNTER — Other Ambulatory Visit (HOSPITAL_BASED_OUTPATIENT_CLINIC_OR_DEPARTMENT_OTHER): Payer: PRIVATE HEALTH INSURANCE

## 2017-04-17 ENCOUNTER — Telehealth: Payer: Self-pay | Admitting: *Deleted

## 2017-04-17 DIAGNOSIS — I809 Phlebitis and thrombophlebitis of unspecified site: Secondary | ICD-10-CM | POA: Diagnosis not present

## 2017-04-17 LAB — PROTIME-INR
INR: 1.2 — ABNORMAL LOW (ref 2.00–3.50)
Protime: 14.4 Seconds — ABNORMAL HIGH (ref 10.6–13.4)

## 2017-04-17 NOTE — Telephone Encounter (Signed)
INR reviewed by Dr. Benay Spice: Change Coumadin dose to 5mg  daily except on MWF take 2.5 mg.  Left message for pt to call office.

## 2017-04-17 NOTE — Telephone Encounter (Signed)
This RN called patient's home and left instructions on how to take Coumadin. Per Dr. Benay Spice, take 2.5 mg on M/W/F and 5 mg on all other days. Instructed patient to call [facility] if she had any questions or concerns.

## 2017-04-24 ENCOUNTER — Telehealth: Payer: Self-pay | Admitting: *Deleted

## 2017-04-24 NOTE — Telephone Encounter (Signed)
Call from pt asking why she has lab again so soon. Coumadin dose was changed on 5/7, Dr. Benay Spice wanted repeat INR 5/15. Pt voiced understanding, requests message to be left at home number with result/ instructions.

## 2017-04-25 ENCOUNTER — Telehealth: Payer: Self-pay | Admitting: *Deleted

## 2017-04-25 ENCOUNTER — Other Ambulatory Visit (HOSPITAL_BASED_OUTPATIENT_CLINIC_OR_DEPARTMENT_OTHER): Payer: PRIVATE HEALTH INSURANCE

## 2017-04-25 DIAGNOSIS — I82402 Acute embolism and thrombosis of unspecified deep veins of left lower extremity: Secondary | ICD-10-CM

## 2017-04-25 DIAGNOSIS — I809 Phlebitis and thrombophlebitis of unspecified site: Secondary | ICD-10-CM

## 2017-04-25 LAB — PROTIME-INR
INR: 1.1 — ABNORMAL LOW (ref 2.00–3.50)
Protime: 13.2 Seconds (ref 10.6–13.4)

## 2017-04-25 NOTE — Telephone Encounter (Signed)
Message left with patient to inform her to change Coumadin dose to 2.5 mg on Monday and Thursday and 5 mg all other days and that we will recheck PT in two weeks.  Instructed pt to call Kendall [place] back to confirm that she received this message.

## 2017-04-26 NOTE — Telephone Encounter (Signed)
Pt returned call, she received message with Coumadin instructions.

## 2017-04-29 ENCOUNTER — Telehealth: Payer: Self-pay

## 2017-04-29 NOTE — Telephone Encounter (Signed)
Called and left a message with lab appt per inbox  Tram Wrenn

## 2017-05-09 ENCOUNTER — Other Ambulatory Visit: Payer: PRIVATE HEALTH INSURANCE

## 2017-05-11 ENCOUNTER — Telehealth: Payer: Self-pay | Admitting: *Deleted

## 2017-05-11 ENCOUNTER — Other Ambulatory Visit (HOSPITAL_BASED_OUTPATIENT_CLINIC_OR_DEPARTMENT_OTHER): Payer: PRIVATE HEALTH INSURANCE

## 2017-05-11 DIAGNOSIS — I82402 Acute embolism and thrombosis of unspecified deep veins of left lower extremity: Secondary | ICD-10-CM | POA: Diagnosis not present

## 2017-05-11 DIAGNOSIS — I809 Phlebitis and thrombophlebitis of unspecified site: Secondary | ICD-10-CM

## 2017-05-11 LAB — PROTIME-INR
INR: 1.7 — ABNORMAL LOW (ref 2.00–3.50)
Protime: 20.4 Seconds — ABNORMAL HIGH (ref 10.6–13.4)

## 2017-05-11 NOTE — Telephone Encounter (Signed)
Message left for patient to call [place] back regarding Coumadin dosing.  Order per Dr. Benay Spice is to continue Coumadin at same dose and to return in one month for lab.

## 2017-05-12 NOTE — Telephone Encounter (Signed)
Pt returned call, instructed her to continue same dose of Coumadin. Will recheck lab in one month. She voiced understanding.

## 2017-05-19 ENCOUNTER — Telehealth: Payer: Self-pay

## 2017-05-19 NOTE — Telephone Encounter (Signed)
Called and left a message with lab appt per inbox

## 2017-05-23 ENCOUNTER — Other Ambulatory Visit: Payer: No Typology Code available for payment source

## 2017-06-08 ENCOUNTER — Other Ambulatory Visit (HOSPITAL_BASED_OUTPATIENT_CLINIC_OR_DEPARTMENT_OTHER): Payer: PRIVATE HEALTH INSURANCE

## 2017-06-08 ENCOUNTER — Telehealth: Payer: Self-pay | Admitting: *Deleted

## 2017-06-08 ENCOUNTER — Other Ambulatory Visit: Payer: PRIVATE HEALTH INSURANCE

## 2017-06-08 DIAGNOSIS — I809 Phlebitis and thrombophlebitis of unspecified site: Secondary | ICD-10-CM | POA: Diagnosis not present

## 2017-06-08 DIAGNOSIS — I82402 Acute embolism and thrombosis of unspecified deep veins of left lower extremity: Secondary | ICD-10-CM

## 2017-06-08 LAB — PROTIME-INR
INR: 1.7 — ABNORMAL LOW (ref 2.00–3.50)
Protime: 20.4 Seconds — ABNORMAL HIGH (ref 10.6–13.4)

## 2017-06-08 NOTE — Telephone Encounter (Signed)
Left message instructing pt to continue same dose of Coumadin. Check lab in one month. Message to schedulers to adjust July appt.

## 2017-06-12 ENCOUNTER — Telehealth: Payer: Self-pay | Admitting: Oncology

## 2017-06-12 NOTE — Telephone Encounter (Signed)
lvm to inform pt of r/s lab appt to 7/18 at 0745 per sch msg

## 2017-06-21 ENCOUNTER — Other Ambulatory Visit: Payer: No Typology Code available for payment source

## 2017-06-28 ENCOUNTER — Other Ambulatory Visit (HOSPITAL_BASED_OUTPATIENT_CLINIC_OR_DEPARTMENT_OTHER): Payer: PRIVATE HEALTH INSURANCE

## 2017-06-28 DIAGNOSIS — I82402 Acute embolism and thrombosis of unspecified deep veins of left lower extremity: Secondary | ICD-10-CM | POA: Diagnosis not present

## 2017-06-28 DIAGNOSIS — I809 Phlebitis and thrombophlebitis of unspecified site: Secondary | ICD-10-CM

## 2017-06-28 LAB — PROTIME-INR
INR: 2.2 (ref 2.00–3.50)
Protime: 26.4 Seconds — ABNORMAL HIGH (ref 10.6–13.4)

## 2017-07-18 ENCOUNTER — Other Ambulatory Visit (HOSPITAL_BASED_OUTPATIENT_CLINIC_OR_DEPARTMENT_OTHER): Payer: PRIVATE HEALTH INSURANCE

## 2017-07-18 DIAGNOSIS — I809 Phlebitis and thrombophlebitis of unspecified site: Secondary | ICD-10-CM

## 2017-07-18 LAB — PROTIME-INR
INR: 1.4 — ABNORMAL LOW (ref 2.00–3.50)
Protime: 16.8 Seconds — ABNORMAL HIGH (ref 10.6–13.4)

## 2017-07-20 ENCOUNTER — Other Ambulatory Visit: Payer: Self-pay | Admitting: Nurse Practitioner

## 2017-07-20 ENCOUNTER — Telehealth: Payer: Self-pay | Admitting: Nurse Practitioner

## 2017-07-20 DIAGNOSIS — I809 Phlebitis and thrombophlebitis of unspecified site: Secondary | ICD-10-CM

## 2017-07-20 NOTE — Telephone Encounter (Signed)
Left message to continue same dose of Coumadin. Repeat lab in 1 month.

## 2017-08-22 ENCOUNTER — Ambulatory Visit (HOSPITAL_BASED_OUTPATIENT_CLINIC_OR_DEPARTMENT_OTHER): Payer: PRIVATE HEALTH INSURANCE | Admitting: Oncology

## 2017-08-22 ENCOUNTER — Other Ambulatory Visit: Payer: Self-pay

## 2017-08-22 ENCOUNTER — Telehealth: Payer: Self-pay

## 2017-08-22 ENCOUNTER — Other Ambulatory Visit (HOSPITAL_BASED_OUTPATIENT_CLINIC_OR_DEPARTMENT_OTHER): Payer: PRIVATE HEALTH INSURANCE

## 2017-08-22 VITALS — BP 157/83 | HR 63 | Temp 98.0°F | Resp 19 | Ht 67.0 in | Wt 231.2 lb

## 2017-08-22 DIAGNOSIS — I809 Phlebitis and thrombophlebitis of unspecified site: Secondary | ICD-10-CM

## 2017-08-22 DIAGNOSIS — Z7901 Long term (current) use of anticoagulants: Secondary | ICD-10-CM

## 2017-08-22 DIAGNOSIS — Z86718 Personal history of other venous thrombosis and embolism: Secondary | ICD-10-CM

## 2017-08-22 DIAGNOSIS — M79604 Pain in right leg: Secondary | ICD-10-CM | POA: Diagnosis not present

## 2017-08-22 DIAGNOSIS — G8929 Other chronic pain: Secondary | ICD-10-CM

## 2017-08-22 DIAGNOSIS — M79605 Pain in left leg: Secondary | ICD-10-CM

## 2017-08-22 LAB — PROTIME-INR
INR: 1.3 — ABNORMAL LOW (ref 2.00–3.50)
Protime: 15.6 Seconds — ABNORMAL HIGH (ref 10.6–13.4)

## 2017-08-22 MED ORDER — WARFARIN SODIUM 5 MG PO TABS: ORAL_TABLET | ORAL | 11 refills | Status: DC

## 2017-08-22 NOTE — Progress Notes (Signed)
  [facility] OFFICE PROGRESS NOTE   Diagnosis: Venous thrombosis, Coumadin anticoagulation  INTERVAL HISTORY:   [name] [name] returns for a scheduled visit. She feels well. No bleeding or symptom of thrombosis. She is taking Coumadin.  Objective:  Vital signs in last 24 hours:  Blood pressure (!) 157/83, pulse 63, temperature 98 F (36.7 C), temperature source Oral, resp. rate 19, height 5\' 7"  (1.702 m), weight 231 lb 3.2 oz (104.9 kg), SpO2 100 %.   Resp: Lungs clear bilaterally Cardio: Regular rate and rhythm GI: No hepatosplenomegaly Vascular: No leg edema, the left lower leg is slightly larger than the right side. 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    Lab Results:  Lab Results  Component Value Date   WBC 6.9 09/14/2016   HGB 12.1 09/14/2016   HCT 36.3 09/14/2016   MCV 88.8 09/14/2016   PLT 292 09/14/2016   NEUTROABS 3.7 09/14/2016    Lab Results  Component Value Date   INR 1.30 (L) 08/22/2017    Medications: I have reviewed the patient's current medications.  Assessment/Plan: 1. 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 2. 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 Repeat lupus and anticardiolipin antibody testing was negative until there was a positive lupus anticoagulant panel on 05/27/2008. There was a positive lupus anticoagulant and hexagonal phase study on 10/27/2009. She would like to continue Coumadin anticoagulation.  3. History of Anemia secondary to menorrhagia and iron deficiency. She is now amenorrheic. 4. Chronic bilateral leg pain.    Disposition:  [name] [name] appears unchanged. The PT INR is subtherapeutic. We adjusted the Coumadin dose. She will return for a PT/INR in one month. We will adjust the Coumadin dose to achieve a therapeutic INR.  We discussed repeat antiphospholipid testing with the potential  to discontinue Coumadin. She is most comfortable continuing Coumadin.  [name] [name] will return for an office visit in one year.    [name] Romberg, MD  08/22/2017  9:06 AM

## 2017-08-22 NOTE — Telephone Encounter (Signed)
Print patient avs and calender for upcoming appointments. Per 9/11 los

## 2017-09-21 ENCOUNTER — Other Ambulatory Visit (HOSPITAL_BASED_OUTPATIENT_CLINIC_OR_DEPARTMENT_OTHER): Payer: PRIVATE HEALTH INSURANCE

## 2017-09-21 DIAGNOSIS — Z7901 Long term (current) use of anticoagulants: Secondary | ICD-10-CM | POA: Diagnosis not present

## 2017-09-21 DIAGNOSIS — I809 Phlebitis and thrombophlebitis of unspecified site: Secondary | ICD-10-CM

## 2017-09-21 DIAGNOSIS — Z86718 Personal history of other venous thrombosis and embolism: Secondary | ICD-10-CM

## 2017-09-21 LAB — PROTIME-INR
INR: 1.5 — ABNORMAL LOW (ref 2.00–3.50)
Protime: 18 Seconds — ABNORMAL HIGH (ref 10.6–13.4)

## 2017-09-22 ENCOUNTER — Telehealth: Payer: Self-pay | Admitting: Emergency Medicine

## 2017-09-22 NOTE — Telephone Encounter (Addendum)
Called patient to inform her of this note. Phone is out of service at this time. Tried work phone but no answer and no VM set up. Will try again.   ----- Message from Ladell Pier, MD sent at 09/21/2017  3:58 PM EDT ----- Please call patient, if taking coumadin consistently, then change to 5 mg daily except 2.5mg  on Mon. Thursday, repeat PT 1 month

## 2017-09-25 NOTE — Telephone Encounter (Signed)
Called pt, she denies any medication changes or missed Coumadin doses. Instructed her to change Coumadin dose to 5mg  daily except Mon+Thurs take 2.5 mg. Pt voiced understanding. She will await call from schedulers for lab in one month.

## 2017-11-12 ENCOUNTER — Other Ambulatory Visit: Payer: Self-pay | Admitting: Oncology

## 2017-12-19 ENCOUNTER — Telehealth: Payer: Self-pay | Admitting: Oncology

## 2017-12-19 NOTE — Telephone Encounter (Signed)
Called patient and left voicemail for patient regarding appt that was added per 1/8 sch msg

## 2017-12-20 ENCOUNTER — Other Ambulatory Visit: Payer: Self-pay | Admitting: Oncology

## 2017-12-21 ENCOUNTER — Other Ambulatory Visit: Payer: Self-pay | Admitting: *Deleted

## 2017-12-21 DIAGNOSIS — I809 Phlebitis and thrombophlebitis of unspecified site: Secondary | ICD-10-CM

## 2017-12-22 ENCOUNTER — Other Ambulatory Visit: Payer: Self-pay

## 2017-12-22 ENCOUNTER — Telehealth: Payer: Self-pay

## 2017-12-22 ENCOUNTER — Inpatient Hospital Stay: Payer: Managed Care, Other (non HMO) | Attending: Oncology

## 2017-12-22 DIAGNOSIS — Z7901 Long term (current) use of anticoagulants: Secondary | ICD-10-CM | POA: Insufficient documentation

## 2017-12-22 DIAGNOSIS — I809 Phlebitis and thrombophlebitis of unspecified site: Secondary | ICD-10-CM

## 2017-12-22 DIAGNOSIS — Z86718 Personal history of other venous thrombosis and embolism: Secondary | ICD-10-CM | POA: Diagnosis not present

## 2017-12-22 LAB — PROTIME-INR
INR: 1.33
Prothrombin Time: 16.4 seconds — ABNORMAL HIGH (ref 11.4–15.2)

## 2017-12-22 NOTE — Telephone Encounter (Signed)
Spoke with patient regarding message below. Patient taking consistenly. Per Dr. Benay Spice, will increase to 5mg  daily and repeat labs in 3wks. Patient voiced understanding.

## 2017-12-22 NOTE — Telephone Encounter (Signed)
-----   Message from Ladell Pier, MD sent at 12/22/2017  2:23 PM EST ----- Please call patient, if she is taking Coumadin consistently, confirmed dose.  We will increase the Coumadin dose if she has been taking as recommended

## 2017-12-25 ENCOUNTER — Telehealth: Payer: Self-pay | Admitting: Oncology

## 2017-12-25 NOTE — Telephone Encounter (Signed)
Scheduled appt per 1/11 sch message - left message with appt date and time.

## 2018-01-11 ENCOUNTER — Inpatient Hospital Stay: Payer: Managed Care, Other (non HMO)

## 2018-01-11 ENCOUNTER — Telehealth: Payer: Self-pay

## 2018-01-11 DIAGNOSIS — Z7901 Long term (current) use of anticoagulants: Secondary | ICD-10-CM | POA: Diagnosis not present

## 2018-01-11 DIAGNOSIS — I809 Phlebitis and thrombophlebitis of unspecified site: Secondary | ICD-10-CM

## 2018-01-11 LAB — PROTIME-INR
INR: 1.87
Prothrombin Time: 21.4 seconds — ABNORMAL HIGH (ref 11.4–15.2)

## 2018-01-11 NOTE — Telephone Encounter (Signed)
err

## 2018-01-12 ENCOUNTER — Other Ambulatory Visit: Payer: Managed Care, Other (non HMO)

## 2018-01-12 ENCOUNTER — Telehealth: Payer: Self-pay

## 2018-01-12 DIAGNOSIS — I809 Phlebitis and thrombophlebitis of unspecified site: Secondary | ICD-10-CM

## 2018-01-12 NOTE — Telephone Encounter (Addendum)
Pt husband voiced understanding, will relay message.    ----- Message from Ladell Pier, MD sent at 01/11/2018  5:34 PM EST ----- Please call patient, PT is better but still below the therapeutic range.  Continue current dose of Coumadin and repeat PT/INR in 3 weeks

## 2018-01-15 ENCOUNTER — Telehealth: Payer: Self-pay | Admitting: Oncology

## 2018-01-15 NOTE — Telephone Encounter (Signed)
Scheduled appt per 2/1 sch message - pt is aware of appt date and time  

## 2018-02-01 ENCOUNTER — Inpatient Hospital Stay: Payer: Managed Care, Other (non HMO) | Attending: Oncology

## 2018-02-01 DIAGNOSIS — Z7901 Long term (current) use of anticoagulants: Secondary | ICD-10-CM | POA: Diagnosis not present

## 2018-02-01 DIAGNOSIS — Z86718 Personal history of other venous thrombosis and embolism: Secondary | ICD-10-CM | POA: Insufficient documentation

## 2018-02-01 DIAGNOSIS — I809 Phlebitis and thrombophlebitis of unspecified site: Secondary | ICD-10-CM

## 2018-02-01 LAB — PROTIME-INR
INR: 1.44
Prothrombin Time: 17.4 seconds — ABNORMAL HIGH (ref 11.4–15.2)

## 2018-02-02 ENCOUNTER — Telehealth: Payer: Self-pay | Admitting: *Deleted

## 2018-02-02 NOTE — Telephone Encounter (Signed)
Left message for pt to call office

## 2018-02-02 NOTE — Telephone Encounter (Signed)
-----   Message from Ladell Pier, MD sent at 02/01/2018  8:15 PM EST ----- Please call patient, is she taking coumadin consistently, if yes- we will change dose

## 2018-02-05 ENCOUNTER — Telehealth: Payer: Self-pay

## 2018-02-05 DIAGNOSIS — I809 Phlebitis and thrombophlebitis of unspecified site: Secondary | ICD-10-CM

## 2018-02-05 NOTE — Telephone Encounter (Signed)
Returned call to pt regarding message from [place], [place] on 2/21. Pt confirms that she is taking 5mg  coumadin daily. Will consult with Dr. Benay Spice and call back with adjusted dosage. Pt voiced understanding.

## 2018-02-05 NOTE — Telephone Encounter (Signed)
LVM for pt with updated coumadin dose. Pt to take 5mg  every day except for 7.5mg  on Monday/Thurs. Will recheck counts in 3 weeks.

## 2018-02-06 ENCOUNTER — Telehealth: Payer: Self-pay | Admitting: Oncology

## 2018-02-06 NOTE — Telephone Encounter (Signed)
Scheduled appt per 2/25 sch message - left message with appt date and time.

## 2018-02-26 ENCOUNTER — Inpatient Hospital Stay: Payer: Managed Care, Other (non HMO) | Attending: Oncology

## 2018-02-26 ENCOUNTER — Other Ambulatory Visit: Payer: Managed Care, Other (non HMO)

## 2018-02-26 ENCOUNTER — Telehealth: Payer: Self-pay | Admitting: *Deleted

## 2018-02-26 DIAGNOSIS — I809 Phlebitis and thrombophlebitis of unspecified site: Secondary | ICD-10-CM | POA: Insufficient documentation

## 2018-02-26 LAB — PROTIME-INR
INR: 2.07
Prothrombin Time: 23.1 seconds — ABNORMAL HIGH (ref 11.4–15.2)

## 2018-02-26 NOTE — Telephone Encounter (Signed)
-----   Message from Ladell Pier, MD sent at 02/26/2018  5:13 PM EDT ----- Please call patient, the PT is therapeutic, continue Coumadin-same dose, check PT/INR in 1 month

## 2018-04-06 ENCOUNTER — Telehealth: Payer: Self-pay | Admitting: Oncology

## 2018-04-06 ENCOUNTER — Other Ambulatory Visit: Payer: Self-pay

## 2018-04-06 DIAGNOSIS — I82402 Acute embolism and thrombosis of unspecified deep veins of left lower extremity: Secondary | ICD-10-CM

## 2018-04-06 NOTE — Telephone Encounter (Signed)
Scheduled appt per 4/26 sch msg - left vm for pt with appts

## 2018-04-09 ENCOUNTER — Inpatient Hospital Stay: Payer: Managed Care, Other (non HMO) | Attending: Oncology

## 2018-04-09 ENCOUNTER — Telehealth: Payer: Self-pay | Admitting: Oncology

## 2018-04-09 ENCOUNTER — Telehealth: Payer: Self-pay | Admitting: *Deleted

## 2018-04-09 DIAGNOSIS — Z86718 Personal history of other venous thrombosis and embolism: Secondary | ICD-10-CM | POA: Diagnosis present

## 2018-04-09 DIAGNOSIS — Z7901 Long term (current) use of anticoagulants: Secondary | ICD-10-CM | POA: Insufficient documentation

## 2018-04-09 DIAGNOSIS — I809 Phlebitis and thrombophlebitis of unspecified site: Secondary | ICD-10-CM

## 2018-04-09 DIAGNOSIS — I82402 Acute embolism and thrombosis of unspecified deep veins of left lower extremity: Secondary | ICD-10-CM

## 2018-04-09 LAB — PROTIME-INR
INR: 2.32
Prothrombin Time: 25.3 seconds — ABNORMAL HIGH (ref 11.4–15.2)

## 2018-04-09 NOTE — Telephone Encounter (Signed)
Scheduled appt per 4/29 sch message - left message and sent reminder letter in the mail with appt date and time.

## 2018-04-09 NOTE — Telephone Encounter (Signed)
Left message for pt to continue same dose of Coumadin, per Dr. Benay Spice. Message to schedulers for lab appt in one month.

## 2018-04-09 NOTE — Telephone Encounter (Signed)
-----   Message from Ladell Pier, MD sent at 04/09/2018  1:54 PM EDT ----- Please call patient, same Coumadin, check PT/INR in 1 month

## 2018-05-04 ENCOUNTER — Telehealth: Payer: Self-pay | Admitting: Oncology

## 2018-05-04 ENCOUNTER — Inpatient Hospital Stay: Payer: Managed Care, Other (non HMO) | Attending: Oncology

## 2018-05-04 ENCOUNTER — Telehealth: Payer: Self-pay

## 2018-05-04 DIAGNOSIS — Z7901 Long term (current) use of anticoagulants: Secondary | ICD-10-CM | POA: Diagnosis present

## 2018-05-04 DIAGNOSIS — Z86718 Personal history of other venous thrombosis and embolism: Secondary | ICD-10-CM | POA: Insufficient documentation

## 2018-05-04 DIAGNOSIS — I809 Phlebitis and thrombophlebitis of unspecified site: Secondary | ICD-10-CM

## 2018-05-04 LAB — PROTIME-INR
INR: 2.13
Prothrombin Time: 23.6 seconds — ABNORMAL HIGH (ref 11.4–15.2)

## 2018-05-04 NOTE — Telephone Encounter (Signed)
Patient called to reschedule  °

## 2018-05-04 NOTE — Telephone Encounter (Addendum)
Pt voiced understanding of message below ----- Message from Ladell Pier, MD sent at 05/04/2018 11:56 AM EDT ----- Please call patient, continue Coumadin-same dose, check PT/INR in 1 month

## 2018-05-09 ENCOUNTER — Other Ambulatory Visit: Payer: Managed Care, Other (non HMO)

## 2018-06-08 ENCOUNTER — Other Ambulatory Visit: Payer: Managed Care, Other (non HMO)

## 2018-06-18 ENCOUNTER — Inpatient Hospital Stay: Payer: Managed Care, Other (non HMO) | Attending: Oncology

## 2018-06-18 DIAGNOSIS — I809 Phlebitis and thrombophlebitis of unspecified site: Secondary | ICD-10-CM

## 2018-06-18 DIAGNOSIS — Z86718 Personal history of other venous thrombosis and embolism: Secondary | ICD-10-CM | POA: Insufficient documentation

## 2018-06-18 DIAGNOSIS — Z7901 Long term (current) use of anticoagulants: Secondary | ICD-10-CM | POA: Insufficient documentation

## 2018-06-18 LAB — PROTIME-INR
INR: 1.45
Prothrombin Time: 17.5 seconds — ABNORMAL HIGH (ref 11.4–15.2)

## 2018-07-05 ENCOUNTER — Other Ambulatory Visit: Payer: Self-pay

## 2018-07-05 DIAGNOSIS — I809 Phlebitis and thrombophlebitis of unspecified site: Secondary | ICD-10-CM

## 2018-07-09 ENCOUNTER — Inpatient Hospital Stay: Payer: Managed Care, Other (non HMO)

## 2018-07-09 ENCOUNTER — Other Ambulatory Visit: Payer: Managed Care, Other (non HMO)

## 2018-07-09 DIAGNOSIS — Z7901 Long term (current) use of anticoagulants: Secondary | ICD-10-CM | POA: Diagnosis not present

## 2018-07-09 DIAGNOSIS — I809 Phlebitis and thrombophlebitis of unspecified site: Secondary | ICD-10-CM

## 2018-07-09 LAB — PROTIME-INR
INR: 1.54
Prothrombin Time: 18.4 seconds — ABNORMAL HIGH (ref 11.4–15.2)

## 2018-07-10 ENCOUNTER — Telehealth: Payer: Self-pay

## 2018-07-10 DIAGNOSIS — I809 Phlebitis and thrombophlebitis of unspecified site: Secondary | ICD-10-CM

## 2018-07-10 NOTE — Telephone Encounter (Addendum)
Pt voiced understanding. Pt states "I've been having cramps in my legs, can that be related to my PT/INR?". This RN will consult MD.  Per Lattie Haw, NP, leg cramping not related to PT/INR levels   LVM for pt.    ----- Message from Ladell Pier, MD sent at 07/09/2018  2:10 PM EDT ----- Please call patient, the PT/INR is low, be sure she is taking Coumadin, increase the Coumadin to 5 mg daily, check PT/INR in 3 weeks

## 2018-08-09 ENCOUNTER — Inpatient Hospital Stay: Payer: Managed Care, Other (non HMO)

## 2018-08-10 ENCOUNTER — Telehealth: Payer: Self-pay | Admitting: Oncology

## 2018-08-10 ENCOUNTER — Inpatient Hospital Stay: Payer: Managed Care, Other (non HMO) | Attending: Oncology

## 2018-08-10 DIAGNOSIS — Z7901 Long term (current) use of anticoagulants: Secondary | ICD-10-CM | POA: Insufficient documentation

## 2018-08-10 DIAGNOSIS — Z86711 Personal history of pulmonary embolism: Secondary | ICD-10-CM | POA: Insufficient documentation

## 2018-08-10 DIAGNOSIS — Z86718 Personal history of other venous thrombosis and embolism: Secondary | ICD-10-CM | POA: Diagnosis present

## 2018-08-10 DIAGNOSIS — I809 Phlebitis and thrombophlebitis of unspecified site: Secondary | ICD-10-CM

## 2018-08-10 LAB — PROTIME-INR
INR: 1.99
Prothrombin Time: 22.5 seconds — ABNORMAL HIGH (ref 11.4–15.2)

## 2018-08-10 NOTE — Telephone Encounter (Signed)
Patient stopped by after lab today and moved 9/11 lab/fu to 9/16. Date/time per patient - needs early AM. Patient given updated appointment calendar.

## 2018-08-22 ENCOUNTER — Ambulatory Visit: Payer: PRIVATE HEALTH INSURANCE | Admitting: Oncology

## 2018-08-22 ENCOUNTER — Other Ambulatory Visit: Payer: PRIVATE HEALTH INSURANCE

## 2018-08-24 ENCOUNTER — Other Ambulatory Visit: Payer: Self-pay | Admitting: *Deleted

## 2018-08-24 DIAGNOSIS — I82402 Acute embolism and thrombosis of unspecified deep veins of left lower extremity: Secondary | ICD-10-CM

## 2018-08-24 DIAGNOSIS — I809 Phlebitis and thrombophlebitis of unspecified site: Secondary | ICD-10-CM

## 2018-08-27 ENCOUNTER — Inpatient Hospital Stay (HOSPITAL_BASED_OUTPATIENT_CLINIC_OR_DEPARTMENT_OTHER): Payer: Managed Care, Other (non HMO) | Admitting: Oncology

## 2018-08-27 ENCOUNTER — Telehealth: Payer: Self-pay

## 2018-08-27 ENCOUNTER — Inpatient Hospital Stay: Payer: Managed Care, Other (non HMO) | Attending: Oncology

## 2018-08-27 VITALS — BP 151/88 | HR 69 | Temp 98.0°F | Resp 17 | Ht 67.0 in | Wt 232.4 lb

## 2018-08-27 DIAGNOSIS — Z86718 Personal history of other venous thrombosis and embolism: Secondary | ICD-10-CM

## 2018-08-27 DIAGNOSIS — Z862 Personal history of diseases of the blood and blood-forming organs and certain disorders involving the immune mechanism: Secondary | ICD-10-CM

## 2018-08-27 DIAGNOSIS — I82402 Acute embolism and thrombosis of unspecified deep veins of left lower extremity: Secondary | ICD-10-CM

## 2018-08-27 DIAGNOSIS — R760 Raised antibody titer: Secondary | ICD-10-CM | POA: Insufficient documentation

## 2018-08-27 DIAGNOSIS — Z7901 Long term (current) use of anticoagulants: Secondary | ICD-10-CM | POA: Insufficient documentation

## 2018-08-27 DIAGNOSIS — M79604 Pain in right leg: Secondary | ICD-10-CM

## 2018-08-27 DIAGNOSIS — M79605 Pain in left leg: Secondary | ICD-10-CM

## 2018-08-27 DIAGNOSIS — G8929 Other chronic pain: Secondary | ICD-10-CM | POA: Diagnosis not present

## 2018-08-27 DIAGNOSIS — I809 Phlebitis and thrombophlebitis of unspecified site: Secondary | ICD-10-CM

## 2018-08-27 DIAGNOSIS — N912 Amenorrhea, unspecified: Secondary | ICD-10-CM | POA: Insufficient documentation

## 2018-08-27 LAB — PROTIME-INR
INR: 2.06
Prothrombin Time: 23.1 seconds — ABNORMAL HIGH (ref 11.4–15.2)

## 2018-08-27 NOTE — Progress Notes (Signed)
  [place] OFFICE PROGRESS NOTE   Diagnosis: Recurrent venous thrombosis, chronic Coumadin anticoagulation  INTERVAL HISTORY:   [name] [name] returns for scheduled visit.  She continues Coumadin.  She is currently taking Coumadin at a dose of 5 mg daily.  No symptoms of thrombosis.  No bleeding.  No complaint.  Objective:  Vital signs in last 24 hours:  Blood pressure (!) 151/88, pulse 69, temperature 98 F (36.7 C), temperature source Oral, resp. rate 17, height 5\' 7"  (1.702 m), weight 232 lb 6.4 oz (105.4 kg), SpO2 100 %.     Resp: Lungs clear bilaterally Cardio: Regular rate and rhythm GI: No hepatosplenomegaly Vascular: No leg edema, venous varicosities at the left greater than right lower leg     Lab Results:   No results found for: CEA1  Lab Results  Component Value Date   INR 2.06 08/27/2018    Medications: I have reviewed the patient's current medications.   Assessment/Plan: 1. 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 2. 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 Repeat lupus and anticardiolipin antibody testing was negative until there was a positive lupus anticoagulant panel on 05/27/2008. There was a positive lupus anticoagulant and hexagonal phase study on 10/27/2009. She would like to continue Coumadin anticoagulation.  3. History of Anemia secondary to menorrhagia and iron deficiency. She is now amenorrheic. 4. Chronic bilateral leg pain.   Disposition: [name] [name] appears stable.  She is tolerating the Coumadin well.  The PT/INR is in the therapeutic range.  We discussed repeat lupus anticoagulant testing with the potential to discontinue Coumadin if testing were negative.  She would like to continue Coumadin.  She will return for a PT/INR in 1 month.  She will be scheduled for an office visit in 1 year.  15 minutes were spent with the  patient today.  The majority of the time was used for counseling and coordination of care.  Betsy Coder, MD  08/27/2018  8:44 AM

## 2018-08-27 NOTE — Telephone Encounter (Signed)
Printed avs and calender of upcoming appointment. Per 9/16 los 

## 2018-09-24 ENCOUNTER — Inpatient Hospital Stay: Payer: Managed Care, Other (non HMO) | Attending: Oncology

## 2018-09-24 DIAGNOSIS — Z7901 Long term (current) use of anticoagulants: Secondary | ICD-10-CM | POA: Insufficient documentation

## 2018-09-24 DIAGNOSIS — Z86718 Personal history of other venous thrombosis and embolism: Secondary | ICD-10-CM | POA: Diagnosis present

## 2018-09-24 DIAGNOSIS — I82402 Acute embolism and thrombosis of unspecified deep veins of left lower extremity: Secondary | ICD-10-CM

## 2018-09-24 LAB — PROTIME-INR
INR: 1.77
Prothrombin Time: 20.5 seconds — ABNORMAL HIGH (ref 11.4–15.2)

## 2018-09-25 ENCOUNTER — Telehealth: Payer: Self-pay | Admitting: Emergency Medicine

## 2018-09-25 NOTE — Telephone Encounter (Addendum)
Pt verbalized understanding   ----- Message from Ladell Pier, MD sent at 09/24/2018  6:12 PM EDT ----- Please call patient, same Coumadin, check PT/INR next month, is she taking Coumadin every day?

## 2018-10-22 ENCOUNTER — Telehealth: Payer: Self-pay | Admitting: *Deleted

## 2018-10-22 ENCOUNTER — Inpatient Hospital Stay: Payer: Managed Care, Other (non HMO) | Attending: Oncology

## 2018-10-22 DIAGNOSIS — Z7901 Long term (current) use of anticoagulants: Secondary | ICD-10-CM | POA: Diagnosis present

## 2018-10-22 DIAGNOSIS — Z86718 Personal history of other venous thrombosis and embolism: Secondary | ICD-10-CM | POA: Diagnosis present

## 2018-10-22 DIAGNOSIS — I82402 Acute embolism and thrombosis of unspecified deep veins of left lower extremity: Secondary | ICD-10-CM

## 2018-10-22 LAB — PROTIME-INR
INR: 1.67
Prothrombin Time: 19.5 seconds — ABNORMAL HIGH (ref 11.4–15.2)

## 2018-10-22 NOTE — Telephone Encounter (Signed)
-----   Message from Ladell Pier, MD sent at 10/22/2018  2:03 PM EST ----- Please call patient, PT/INR has been subtherapeutic on multiple occasions over the past few months, confirm she is taking Coumadin daily.  If she has been taking Coumadin consistently, increase the dose to 5 mg daily, check PT/INR in 4 weeks

## 2018-10-22 NOTE — Telephone Encounter (Signed)
Left message for a return call- instructions below.

## 2018-10-25 ENCOUNTER — Telehealth: Payer: Self-pay | Admitting: Oncology

## 2018-10-25 ENCOUNTER — Telehealth: Payer: Self-pay | Admitting: *Deleted

## 2018-10-25 NOTE — Telephone Encounter (Signed)
Called about 12/9

## 2018-10-25 NOTE — Telephone Encounter (Signed)
MD reviewed PT/INR-sub therapeutic again. Will recheck in December and increase dose if still running low.  Current dose 5 mg daily, except 2.5 mg on Monday and Thursday. Requested patient call back if this is no accurate. Above information left on her voice mail as requested

## 2018-10-29 ENCOUNTER — Telehealth: Payer: Self-pay | Admitting: *Deleted

## 2018-10-29 NOTE — Telephone Encounter (Signed)
Patient called to clarify that she has been on coumadin 5 mg daily for at least a month now. Wants to be sure MD does not want to change her dose based on last INR of 1.67 on 11/11. Next PT is on 11/19/18.

## 2018-10-30 NOTE — Telephone Encounter (Signed)
Left VM that per Dr. Benay Spice, continue 5 mg daily and will recheck as scheduled and increase dose at that time if she is still running low.

## 2018-11-06 ENCOUNTER — Other Ambulatory Visit: Payer: Self-pay | Admitting: Oncology

## 2018-11-16 ENCOUNTER — Other Ambulatory Visit: Payer: Self-pay | Admitting: *Deleted

## 2018-11-16 DIAGNOSIS — I82402 Acute embolism and thrombosis of unspecified deep veins of left lower extremity: Secondary | ICD-10-CM

## 2018-11-19 ENCOUNTER — Other Ambulatory Visit: Payer: Managed Care, Other (non HMO)

## 2018-11-26 ENCOUNTER — Inpatient Hospital Stay: Payer: Managed Care, Other (non HMO) | Attending: Oncology

## 2018-11-26 DIAGNOSIS — I82402 Acute embolism and thrombosis of unspecified deep veins of left lower extremity: Secondary | ICD-10-CM

## 2018-11-26 DIAGNOSIS — Z7901 Long term (current) use of anticoagulants: Secondary | ICD-10-CM | POA: Insufficient documentation

## 2018-11-26 DIAGNOSIS — Z86718 Personal history of other venous thrombosis and embolism: Secondary | ICD-10-CM | POA: Insufficient documentation

## 2018-11-26 LAB — PROTIME-INR
INR: 2.37
Prothrombin Time: 25.6 seconds — ABNORMAL HIGH (ref 11.4–15.2)

## 2018-11-28 ENCOUNTER — Telehealth: Payer: Self-pay | Admitting: *Deleted

## 2018-11-28 NOTE — Telephone Encounter (Signed)
-----   Message from Ladell Pier, MD sent at 11/26/2018  4:13 PM EST ----- Please call patient, the PT/INR is therapeutic, continue Coumadin-same dose, repeat PT/INR in 1 month

## 2018-11-28 NOTE — Telephone Encounter (Signed)
TCT patient regarding PT/INR from Monday, 12/16.  No answer but was able to leave meassage for pt to return call.  PT/INR is therapeutic, pt is to continue current Coumadin dose and repeat PT/INR in 1 month.Pt has lab appt on 12/24/17 @ 8:00 am

## 2018-11-28 NOTE — Telephone Encounter (Signed)
Called back and spoke with her husband. Informed him that her INR looks good and for her to continue Coumadin 5 mg daily and recheck on 12/24/18 at 0800. He was able to read message back to nurse.

## 2018-11-29 ENCOUNTER — Telehealth: Payer: Self-pay | Admitting: *Deleted

## 2018-11-29 NOTE — Telephone Encounter (Signed)
Patient left VM requesting nurse call her at home or work number. Attempted to call work with no answer. Left VM on home phone that INR was much better; continue coumadin 5 mg daily and recheck in January as scheduled (12/24/18)

## 2018-12-21 ENCOUNTER — Other Ambulatory Visit: Payer: Self-pay | Admitting: *Deleted

## 2018-12-21 DIAGNOSIS — I82402 Acute embolism and thrombosis of unspecified deep veins of left lower extremity: Secondary | ICD-10-CM

## 2018-12-24 ENCOUNTER — Inpatient Hospital Stay: Payer: Managed Care, Other (non HMO) | Attending: Oncology

## 2018-12-24 DIAGNOSIS — I82402 Acute embolism and thrombosis of unspecified deep veins of left lower extremity: Secondary | ICD-10-CM

## 2018-12-24 DIAGNOSIS — Z86718 Personal history of other venous thrombosis and embolism: Secondary | ICD-10-CM | POA: Diagnosis present

## 2018-12-24 DIAGNOSIS — Z7901 Long term (current) use of anticoagulants: Secondary | ICD-10-CM | POA: Insufficient documentation

## 2018-12-24 LAB — PROTIME-INR
INR: 1.96
Prothrombin Time: 22.1 seconds — ABNORMAL HIGH (ref 11.4–15.2)

## 2018-12-25 ENCOUNTER — Telehealth: Payer: Self-pay | Admitting: *Deleted

## 2018-12-25 NOTE — Telephone Encounter (Signed)
LM with note below. To call if has questions 

## 2018-12-25 NOTE — Telephone Encounter (Signed)
-----   Message from Ladell Pier, MD sent at 12/24/2018  5:17 PM EST ----- Please call patient, same coumadin, repeat PT INR 1 month

## 2018-12-25 NOTE — Telephone Encounter (Signed)
Coumadin dosing charted on flowsheet-continue 5 mg daily

## 2019-01-21 ENCOUNTER — Inpatient Hospital Stay: Payer: Managed Care, Other (non HMO) | Attending: Oncology

## 2019-01-21 DIAGNOSIS — I82402 Acute embolism and thrombosis of unspecified deep veins of left lower extremity: Secondary | ICD-10-CM

## 2019-01-21 DIAGNOSIS — Z7901 Long term (current) use of anticoagulants: Secondary | ICD-10-CM | POA: Insufficient documentation

## 2019-01-21 DIAGNOSIS — Z86718 Personal history of other venous thrombosis and embolism: Secondary | ICD-10-CM | POA: Insufficient documentation

## 2019-01-21 LAB — PROTIME-INR
INR: 1.91
Prothrombin Time: 21.6 seconds — ABNORMAL HIGH (ref 11.4–15.2)

## 2019-01-22 ENCOUNTER — Telehealth: Payer: Self-pay | Admitting: *Deleted

## 2019-01-22 NOTE — Telephone Encounter (Signed)
Reviewed INR results. Continue coumadin 5 mg daily and recheck on 3/9 as scheduled.

## 2019-01-22 NOTE — Telephone Encounter (Signed)
An attempt made to contact patient, left a voice message for a return call.

## 2019-02-18 ENCOUNTER — Inpatient Hospital Stay: Payer: Managed Care, Other (non HMO) | Attending: Oncology

## 2019-02-18 DIAGNOSIS — I82402 Acute embolism and thrombosis of unspecified deep veins of left lower extremity: Secondary | ICD-10-CM

## 2019-02-18 DIAGNOSIS — Z7901 Long term (current) use of anticoagulants: Secondary | ICD-10-CM | POA: Diagnosis not present

## 2019-02-18 DIAGNOSIS — Z86718 Personal history of other venous thrombosis and embolism: Secondary | ICD-10-CM | POA: Insufficient documentation

## 2019-02-18 LAB — PROTIME-INR
INR: 2.5 — ABNORMAL HIGH (ref 0.8–1.2)
Prothrombin Time: 27 seconds — ABNORMAL HIGH (ref 11.4–15.2)

## 2019-02-26 ENCOUNTER — Telehealth: Payer: Self-pay | Admitting: *Deleted

## 2019-02-26 NOTE — Telephone Encounter (Signed)
Patient left VM asking for INR results and coumadin directions. Gives permission to leave voice mail if she does not answer. Called back and left VM that INR 2.5 and continue Coumadin 5 mg daily. Recheck 4/13 as scheduled.

## 2019-03-18 ENCOUNTER — Other Ambulatory Visit: Payer: Managed Care, Other (non HMO)

## 2019-03-25 ENCOUNTER — Inpatient Hospital Stay: Payer: Managed Care, Other (non HMO)

## 2019-03-27 ENCOUNTER — Telehealth: Payer: Self-pay | Admitting: Oncology

## 2019-03-27 NOTE — Telephone Encounter (Signed)
R/s appt per 4/15 sch message - unable to reach patient . Left message with appt date and time

## 2019-03-29 ENCOUNTER — Inpatient Hospital Stay: Payer: Managed Care, Other (non HMO) | Attending: Oncology

## 2019-03-29 ENCOUNTER — Other Ambulatory Visit: Payer: Self-pay

## 2019-03-29 DIAGNOSIS — I82402 Acute embolism and thrombosis of unspecified deep veins of left lower extremity: Secondary | ICD-10-CM | POA: Insufficient documentation

## 2019-03-29 LAB — PROTIME-INR
INR: 1.9 — ABNORMAL HIGH (ref 0.8–1.2)
Prothrombin Time: 21.1 seconds — ABNORMAL HIGH (ref 11.4–15.2)

## 2019-04-04 ENCOUNTER — Telehealth: Payer: Self-pay | Admitting: *Deleted

## 2019-04-04 NOTE — Telephone Encounter (Signed)
Called husband and informed him to let patient know to continue coumadin 5 mg daily and recheck 04/22/19 as scheduled. Charlesia was at work, but he will let her know.

## 2019-04-22 ENCOUNTER — Inpatient Hospital Stay: Payer: Managed Care, Other (non HMO) | Attending: Oncology

## 2019-04-22 ENCOUNTER — Other Ambulatory Visit: Payer: Self-pay

## 2019-04-22 ENCOUNTER — Telehealth: Payer: Self-pay | Admitting: *Deleted

## 2019-04-22 DIAGNOSIS — I82402 Acute embolism and thrombosis of unspecified deep veins of left lower extremity: Secondary | ICD-10-CM

## 2019-04-22 DIAGNOSIS — Z86718 Personal history of other venous thrombosis and embolism: Secondary | ICD-10-CM | POA: Insufficient documentation

## 2019-04-22 LAB — PROTIME-INR
INR: 2.4 — ABNORMAL HIGH (ref 0.8–1.2)
Prothrombin Time: 25.4 seconds — ABNORMAL HIGH (ref 11.4–15.2)

## 2019-04-22 NOTE — Telephone Encounter (Signed)
Attempted call to patient regarding PT/INR results. No answer but was able to leave vm message for pt to call back @ [phone]. PT/INR is in therapeutic range. Pt is to continue current Coumading dosage and return in 1 month for repeat labs

## 2019-04-22 NOTE — Telephone Encounter (Signed)
-----   Message from Ladell Pier, MD sent at 04/22/2019  9:23 AM EDT ----- Please call patient, the PT/INR is therapeutic, continue Coumadin at the same dose, repeat PT/INR in 1 month

## 2019-04-23 ENCOUNTER — Telehealth: Payer: Self-pay

## 2019-04-23 NOTE — Telephone Encounter (Signed)
TC per Dr Benay Spice Please to let patient know the PT/INR is therapeutic, continue Coumadin at the same dose, repeat PT/INR in 1 month. Patient verbalized understanding. No further problems or concerns at this time.

## 2019-05-07 ENCOUNTER — Telehealth: Payer: Self-pay | Admitting: *Deleted

## 2019-05-07 ENCOUNTER — Other Ambulatory Visit: Payer: Self-pay | Admitting: Oncology

## 2019-05-07 NOTE — Telephone Encounter (Signed)
Reports she will have some molar teeth removed in near future and asking what should be done with her coumadin? Per Dr. Benay Spice: suggests holding coumadin for 3 days prior to procedure, then resume at same dose evening of procedure. Could check INR day of procedure to ensure INR came down to normal range. Gave her fax # in case dentist needs to fax anything for clearance. She will provide this information to dentist when she has her consultation on 05/10/19.

## 2019-05-20 ENCOUNTER — Other Ambulatory Visit: Payer: Self-pay

## 2019-05-20 ENCOUNTER — Other Ambulatory Visit: Payer: Self-pay | Admitting: *Deleted

## 2019-05-20 ENCOUNTER — Telehealth: Payer: Self-pay

## 2019-05-20 ENCOUNTER — Inpatient Hospital Stay: Payer: Managed Care, Other (non HMO) | Attending: Oncology

## 2019-05-20 ENCOUNTER — Telehealth: Payer: Self-pay | Admitting: *Deleted

## 2019-05-20 DIAGNOSIS — Z86718 Personal history of other venous thrombosis and embolism: Secondary | ICD-10-CM | POA: Insufficient documentation

## 2019-05-20 DIAGNOSIS — Z7901 Long term (current) use of anticoagulants: Secondary | ICD-10-CM | POA: Insufficient documentation

## 2019-05-20 DIAGNOSIS — I82402 Acute embolism and thrombosis of unspecified deep veins of left lower extremity: Secondary | ICD-10-CM

## 2019-05-20 LAB — PROTIME-INR
INR: 1.9 — ABNORMAL HIGH (ref 0.8–1.2)
Prothrombin Time: 21.3 seconds — ABNORMAL HIGH (ref 11.4–15.2)

## 2019-05-20 NOTE — Telephone Encounter (Signed)
Spoke to patient- continue same dose coumadin and return in 6 weeks. Appt is already schedule. Patient is aware of appt. Updated coumadin flowsheet.

## 2019-05-20 NOTE — Telephone Encounter (Signed)
Left pt vm msg to call back in regards to lab results

## 2019-06-13 ENCOUNTER — Other Ambulatory Visit: Payer: Self-pay | Admitting: Nurse Practitioner

## 2019-06-13 DIAGNOSIS — I82402 Acute embolism and thrombosis of unspecified deep veins of left lower extremity: Secondary | ICD-10-CM

## 2019-06-17 ENCOUNTER — Telehealth: Payer: Self-pay

## 2019-06-17 ENCOUNTER — Inpatient Hospital Stay: Payer: Managed Care, Other (non HMO) | Attending: Oncology

## 2019-06-17 ENCOUNTER — Other Ambulatory Visit: Payer: Self-pay

## 2019-06-17 DIAGNOSIS — I82402 Acute embolism and thrombosis of unspecified deep veins of left lower extremity: Secondary | ICD-10-CM

## 2019-06-17 DIAGNOSIS — Z86718 Personal history of other venous thrombosis and embolism: Secondary | ICD-10-CM | POA: Insufficient documentation

## 2019-06-17 LAB — PROTIME-INR
INR: 2.2 — ABNORMAL HIGH (ref 0.8–1.2)
Prothrombin Time: 23.9 seconds — ABNORMAL HIGH (ref 11.4–15.2)

## 2019-06-17 NOTE — Telephone Encounter (Signed)
Spoke with pt advised per Ned Card NP INR is in goal range and to continue same dose as coumadin. F/U as scheduled. Pt verbalized understanding

## 2019-06-17 NOTE — Telephone Encounter (Signed)
-----   Message from Owens Shark, NP sent at 06/17/2019 11:44 AM EDT ----- Please let her know the INR is in goal range, continue same dose of Coumadin.  Follow-up as scheduled.

## 2019-06-17 NOTE — Telephone Encounter (Signed)
Tried to call pt in regards to lab results Per Ned Card NP below:  Please let her know the INR is in goal range, continue same dose of Coumadin. Follow-up as scheduled.   Could not leave a vm msg for pt to call back.

## 2019-07-12 ENCOUNTER — Other Ambulatory Visit: Payer: Self-pay | Admitting: *Deleted

## 2019-07-12 DIAGNOSIS — I82402 Acute embolism and thrombosis of unspecified deep veins of left lower extremity: Secondary | ICD-10-CM

## 2019-07-15 ENCOUNTER — Other Ambulatory Visit: Payer: Self-pay

## 2019-07-15 ENCOUNTER — Inpatient Hospital Stay: Payer: Managed Care, Other (non HMO) | Attending: Oncology

## 2019-07-15 DIAGNOSIS — I82402 Acute embolism and thrombosis of unspecified deep veins of left lower extremity: Secondary | ICD-10-CM | POA: Diagnosis not present

## 2019-07-15 LAB — PROTIME-INR
INR: 1.7 — ABNORMAL HIGH (ref 0.8–1.2)
Prothrombin Time: 19.5 seconds — ABNORMAL HIGH (ref 11.4–15.2)

## 2019-07-16 ENCOUNTER — Telehealth: Payer: Self-pay

## 2019-07-16 NOTE — Telephone Encounter (Signed)
TC to patient per Dr Benay Spice to let her know stay on same Coumadin, and check PT/INR in 1 month. Patient verbalized understanding. No further problems or concerns at this time.

## 2019-08-07 ENCOUNTER — Other Ambulatory Visit: Payer: Self-pay | Admitting: Oncology

## 2019-08-23 ENCOUNTER — Other Ambulatory Visit: Payer: Self-pay | Admitting: *Deleted

## 2019-08-23 DIAGNOSIS — I82402 Acute embolism and thrombosis of unspecified deep veins of left lower extremity: Secondary | ICD-10-CM

## 2019-08-26 ENCOUNTER — Inpatient Hospital Stay: Payer: Managed Care, Other (non HMO) | Admitting: Oncology

## 2019-08-26 ENCOUNTER — Telehealth: Payer: Self-pay | Admitting: *Deleted

## 2019-08-26 ENCOUNTER — Telehealth: Payer: Self-pay | Admitting: Oncology

## 2019-08-26 ENCOUNTER — Inpatient Hospital Stay: Payer: Managed Care, Other (non HMO) | Attending: Oncology

## 2019-08-26 ENCOUNTER — Other Ambulatory Visit: Payer: Self-pay

## 2019-08-26 VITALS — BP 152/87 | HR 76 | Temp 98.3°F | Resp 18 | Ht 67.0 in | Wt 219.0 lb

## 2019-08-26 DIAGNOSIS — M79605 Pain in left leg: Secondary | ICD-10-CM | POA: Insufficient documentation

## 2019-08-26 DIAGNOSIS — D6862 Lupus anticoagulant syndrome: Secondary | ICD-10-CM | POA: Diagnosis present

## 2019-08-26 DIAGNOSIS — Z86718 Personal history of other venous thrombosis and embolism: Secondary | ICD-10-CM | POA: Insufficient documentation

## 2019-08-26 DIAGNOSIS — I82402 Acute embolism and thrombosis of unspecified deep veins of left lower extremity: Secondary | ICD-10-CM

## 2019-08-26 DIAGNOSIS — M79604 Pain in right leg: Secondary | ICD-10-CM | POA: Diagnosis not present

## 2019-08-26 DIAGNOSIS — D500 Iron deficiency anemia secondary to blood loss (chronic): Secondary | ICD-10-CM | POA: Insufficient documentation

## 2019-08-26 DIAGNOSIS — N920 Excessive and frequent menstruation with regular cycle: Secondary | ICD-10-CM | POA: Diagnosis not present

## 2019-08-26 DIAGNOSIS — Z79899 Other long term (current) drug therapy: Secondary | ICD-10-CM | POA: Diagnosis not present

## 2019-08-26 DIAGNOSIS — Z7901 Long term (current) use of anticoagulants: Secondary | ICD-10-CM | POA: Insufficient documentation

## 2019-08-26 DIAGNOSIS — G8929 Other chronic pain: Secondary | ICD-10-CM | POA: Insufficient documentation

## 2019-08-26 LAB — PROTIME-INR
INR: 2.3 — ABNORMAL HIGH (ref 0.8–1.2)
Prothrombin Time: 24.7 seconds — ABNORMAL HIGH (ref 11.4–15.2)

## 2019-08-26 NOTE — Telephone Encounter (Signed)
Scheduled per 09/14 los, patient received after visit summary and calender. °

## 2019-08-26 NOTE — Progress Notes (Signed)
  [facility] OFFICE PROGRESS NOTE   Diagnosis: Chronic anticoagulation  INTERVAL HISTORY:   Ms. Rempfer returns as scheduled.  She reports feeling well.  No bleeding or symptom of thrombosis.  Good appetite.  Objective:  Vital signs in last 24 hours:  Blood pressure (!) 152/87, pulse 76, temperature 98.3 F (36.8 C), temperature source Oral, resp. rate 18, height 5\' 7"  (1.702 m), weight 219 lb (99.3 kg), SpO2 100 %.    Limited physical examination secondary to distancing with the COVID pandemic GI: Nontender, no mass, no hepatosplenomegaly Vascular: No leg edema, bilateral lower leg varicosities     Lab Results:  Lab Results  Component Value Date   WBC 6.9 09/14/2016   HGB 12.1 09/14/2016   HCT 36.3 09/14/2016   MCV 88.8 09/14/2016   PLT 292 09/14/2016   NEUTROABS 3.7 09/14/2016    CMP  Lab Results  Component Value Date   NA 141 01/07/2016   K 3.7 01/07/2016   CL 105 01/07/2016   CO2 27 01/07/2016   GLUCOSE 103 (H) 01/07/2016   BUN 15 01/07/2016   CREATININE 0.80 01/07/2016   CALCIUM 9.5 01/07/2016   PROT 8.1 01/07/2016   ALBUMIN 4.5 01/07/2016   AST 29 01/07/2016   ALT 33 01/07/2016   ALKPHOS 88 01/07/2016   BILITOT 0.5 01/07/2016   GFRNONAA >60 01/07/2016   GFRAA >60 01/07/2016     Medications: I have reviewed the patient's current medications.   Assessment/Plan: 1. 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 2. 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 Repeat lupus and anticardiolipin antibody testing was negative until there was a positive lupus anticoagulant panel on 05/27/2008. There was a positive lupus anticoagulant and hexagonal phase study on 10/27/2009. She would like to continue Coumadin anticoagulation.  3. History ofAnemia secondary to menorrhagia and iron deficiency.She is now amenorrheic. 4. Chronic bilateral leg pain.   Disposition: Ms. Gim appears unchanged.  She continues Coumadin anticoagulation.  We will follow-up on the PT/INR from today.  She would like to continue indefinite Coumadin anticoagulation.  We decided against repeat antiphospholipid antibody testing.  She will return for an office visit in 1 year.  Betsy Coder, MD  08/26/2019  9:54 AM

## 2019-08-26 NOTE — Telephone Encounter (Signed)
Left VM with INR and to continue Coumadin 5 mg daily.

## 2019-10-04 ENCOUNTER — Telehealth: Payer: Self-pay | Admitting: Oncology

## 2019-10-04 ENCOUNTER — Other Ambulatory Visit: Payer: Self-pay

## 2019-10-04 ENCOUNTER — Inpatient Hospital Stay: Payer: Managed Care, Other (non HMO) | Attending: Oncology

## 2019-10-04 DIAGNOSIS — I82402 Acute embolism and thrombosis of unspecified deep veins of left lower extremity: Secondary | ICD-10-CM | POA: Insufficient documentation

## 2019-10-04 LAB — PROTIME-INR
INR: 1.6 — ABNORMAL HIGH (ref 0.8–1.2)
Prothrombin Time: 18.9 seconds — ABNORMAL HIGH (ref 11.4–15.2)

## 2019-10-04 NOTE — Telephone Encounter (Signed)
Returned patient's phone call regarding rescheduling 10/26 appointment, per patient's request appointment has been moved to 10/23 due to patient's work schedule.

## 2019-10-07 ENCOUNTER — Inpatient Hospital Stay: Payer: Managed Care, Other (non HMO)

## 2019-10-09 ENCOUNTER — Telehealth: Payer: Self-pay | Admitting: *Deleted

## 2019-10-09 NOTE — Telephone Encounter (Signed)
Notified of message below

## 2019-10-09 NOTE — Telephone Encounter (Signed)
-----   Message from Ladell Pier, MD sent at 10/05/2019 10:41 AM EDT ----- Please call patient, same coumadin, check PTINR in 1 month

## 2019-10-18 ENCOUNTER — Other Ambulatory Visit: Payer: Managed Care, Other (non HMO)

## 2019-11-04 ENCOUNTER — Other Ambulatory Visit: Payer: Self-pay | Admitting: Oncology

## 2019-11-18 ENCOUNTER — Other Ambulatory Visit: Payer: Self-pay

## 2019-11-18 ENCOUNTER — Telehealth: Payer: Self-pay

## 2019-11-18 ENCOUNTER — Inpatient Hospital Stay: Payer: Managed Care, Other (non HMO) | Attending: Oncology

## 2019-11-18 DIAGNOSIS — I82402 Acute embolism and thrombosis of unspecified deep veins of left lower extremity: Secondary | ICD-10-CM

## 2019-11-18 LAB — PROTIME-INR
INR: 1.6 — ABNORMAL HIGH (ref 0.8–1.2)
Prothrombin Time: 19.2 seconds — ABNORMAL HIGH (ref 11.4–15.2)

## 2019-11-18 NOTE — Telephone Encounter (Signed)
Left voicemail for patient to call back [facility] 

## 2019-11-20 ENCOUNTER — Telehealth: Payer: Self-pay | Admitting: *Deleted

## 2019-11-20 NOTE — Telephone Encounter (Signed)
-----   Message from Tania Ade, RN sent at 11/20/2019  1:49 PM EST -----  ----- Message ----- From: Ladell Pier, MD Sent: 11/18/2019   1:49 PM EST To: [facility]  Please call patient, be sure she is taking the Coumadin, repeat PT/INR in 1 month, if lower again we will consider increasing the Coumadin dose

## 2019-11-20 NOTE — Telephone Encounter (Signed)
LM with note below 

## 2019-12-30 ENCOUNTER — Inpatient Hospital Stay: Payer: Managed Care, Other (non HMO) | Attending: Oncology

## 2019-12-30 ENCOUNTER — Other Ambulatory Visit: Payer: Self-pay

## 2019-12-30 DIAGNOSIS — I82402 Acute embolism and thrombosis of unspecified deep veins of left lower extremity: Secondary | ICD-10-CM | POA: Insufficient documentation

## 2019-12-30 LAB — PROTIME-INR
INR: 1.8 — ABNORMAL HIGH (ref 0.8–1.2)
Prothrombin Time: 20.6 seconds — ABNORMAL HIGH (ref 11.4–15.2)

## 2020-01-02 ENCOUNTER — Telehealth: Payer: Self-pay | Admitting: *Deleted

## 2020-01-02 NOTE — Telephone Encounter (Addendum)
Called for result/instructions on warfarin. OK to leave VM. Currently on 5 mg daily. Called back and left VM to continue same coumadin dose and and repeat lab on 3/1 as scheduled.

## 2020-02-04 ENCOUNTER — Other Ambulatory Visit: Payer: Self-pay | Admitting: Oncology

## 2020-02-06 ENCOUNTER — Telehealth: Payer: Self-pay | Admitting: Oncology

## 2020-02-06 NOTE — Telephone Encounter (Signed)
Rescheduled per 2/25 sch msg, pt req. Called and left a msg.

## 2020-02-07 ENCOUNTER — Telehealth: Payer: Self-pay | Admitting: Oncology

## 2020-02-07 ENCOUNTER — Telehealth: Payer: Self-pay | Admitting: *Deleted

## 2020-02-07 ENCOUNTER — Other Ambulatory Visit: Payer: Self-pay

## 2020-02-07 ENCOUNTER — Inpatient Hospital Stay: Payer: Managed Care, Other (non HMO) | Attending: Oncology

## 2020-02-07 DIAGNOSIS — I82402 Acute embolism and thrombosis of unspecified deep veins of left lower extremity: Secondary | ICD-10-CM | POA: Insufficient documentation

## 2020-02-07 LAB — PROTIME-INR
INR: 1.2 (ref 0.8–1.2)
Prothrombin Time: 15.4 seconds — ABNORMAL HIGH (ref 11.4–15.2)

## 2020-02-07 NOTE — Telephone Encounter (Signed)
Instructed to take 5 mg Coumadin. Return in 2 weeks for labs. Message to scheduler

## 2020-02-07 NOTE — Telephone Encounter (Signed)
-----   Message from Ladell Pier, MD sent at 02/07/2020  9:32 AM EST ----- Please call patient, the PT/INR is low, if she taking the Coumadin, any new medications?

## 2020-02-07 NOTE — Telephone Encounter (Signed)
Scheduled appt per 2/26 sch message - pt aware of appt

## 2020-02-07 NOTE — Telephone Encounter (Signed)
Notified of results. States she is taking the coumadin as ordered, no new meds

## 2020-02-10 ENCOUNTER — Inpatient Hospital Stay: Payer: Managed Care, Other (non HMO)

## 2020-02-21 ENCOUNTER — Telehealth: Payer: Self-pay | Admitting: *Deleted

## 2020-02-21 ENCOUNTER — Inpatient Hospital Stay: Payer: Managed Care, Other (non HMO) | Attending: Oncology

## 2020-02-21 ENCOUNTER — Other Ambulatory Visit: Payer: Self-pay

## 2020-02-21 DIAGNOSIS — I82402 Acute embolism and thrombosis of unspecified deep veins of left lower extremity: Secondary | ICD-10-CM | POA: Insufficient documentation

## 2020-02-21 LAB — PROTIME-INR
INR: 1.5 — ABNORMAL HIGH (ref 0.8–1.2)
Prothrombin Time: 17.9 seconds — ABNORMAL HIGH (ref 11.4–15.2)

## 2020-02-21 NOTE — Telephone Encounter (Signed)
-----   Message from Ladell Pier, MD sent at 02/21/2020  1:32 PM EST ----- Please call patient, same coumadin, check INR 4 weeks, if still low will increase coumadin dose

## 2020-03-19 ENCOUNTER — Encounter (HOSPITAL_COMMUNITY): Payer: Self-pay

## 2020-03-19 ENCOUNTER — Emergency Department (HOSPITAL_COMMUNITY): Payer: Managed Care, Other (non HMO)

## 2020-03-19 ENCOUNTER — Telehealth: Payer: Self-pay | Admitting: *Deleted

## 2020-03-19 ENCOUNTER — Emergency Department (HOSPITAL_COMMUNITY)
Admission: EM | Admit: 2020-03-19 | Discharge: 2020-03-19 | Disposition: A | Discharge status: Home / Self Care | Payer: Managed Care, Other (non HMO) | Attending: Emergency Medicine | Admitting: Emergency Medicine

## 2020-03-19 ENCOUNTER — Emergency Department (HOSPITAL_BASED_OUTPATIENT_CLINIC_OR_DEPARTMENT_OTHER)
Admit: 2020-03-19 | Discharge: 2020-03-19 | Disposition: A | Discharge status: Home / Self Care | Payer: Managed Care, Other (non HMO) | Attending: Emergency Medicine | Admitting: Emergency Medicine

## 2020-03-19 DIAGNOSIS — R0602 Shortness of breath: Secondary | ICD-10-CM | POA: Insufficient documentation

## 2020-03-19 DIAGNOSIS — Z7901 Long term (current) use of anticoagulants: Secondary | ICD-10-CM | POA: Insufficient documentation

## 2020-03-19 DIAGNOSIS — R609 Edema, unspecified: Secondary | ICD-10-CM

## 2020-03-19 DIAGNOSIS — Z79899 Other long term (current) drug therapy: Secondary | ICD-10-CM | POA: Insufficient documentation

## 2020-03-19 DIAGNOSIS — R2243 Localized swelling, mass and lump, lower limb, bilateral: Secondary | ICD-10-CM | POA: Diagnosis present

## 2020-03-19 DIAGNOSIS — R600 Localized edema: Secondary | ICD-10-CM

## 2020-03-19 DIAGNOSIS — R0989 Other specified symptoms and signs involving the circulatory and respiratory systems: Secondary | ICD-10-CM | POA: Diagnosis not present

## 2020-03-19 DIAGNOSIS — Z87891 Personal history of nicotine dependence: Secondary | ICD-10-CM | POA: Diagnosis not present

## 2020-03-19 LAB — CBC WITH DIFFERENTIAL/PLATELET
Abs Immature Granulocytes: 0.11 10*3/uL — ABNORMAL HIGH (ref 0.00–0.07)
Basophils Absolute: 0 10*3/uL (ref 0.0–0.1)
Basophils Relative: 1 %
Eosinophils Absolute: 0.1 10*3/uL (ref 0.0–0.5)
Eosinophils Relative: 1 %
HCT: 35.5 % — ABNORMAL LOW (ref 36.0–46.0)
Hemoglobin: 11.6 g/dL — ABNORMAL LOW (ref 12.0–15.0)
Immature Granulocytes: 1 %
Lymphocytes Relative: 21 %
Lymphs Abs: 1.7 10*3/uL (ref 0.7–4.0)
MCH: 29.5 pg (ref 26.0–34.0)
MCHC: 32.7 g/dL (ref 30.0–36.0)
MCV: 90.3 fL (ref 80.0–100.0)
Monocytes Absolute: 0.6 10*3/uL (ref 0.1–1.0)
Monocytes Relative: 8 %
Neutro Abs: 5.3 10*3/uL (ref 1.7–7.7)
Neutrophils Relative %: 68 %
Platelets: 375 10*3/uL (ref 150–400)
RBC: 3.93 MIL/uL (ref 3.87–5.11)
RDW: 12.8 % (ref 11.5–15.5)
WBC: 7.8 10*3/uL (ref 4.0–10.5)
nRBC: 0 % (ref 0.0–0.2)

## 2020-03-19 LAB — BASIC METABOLIC PANEL
Anion gap: 12 (ref 5–15)
BUN: 18 mg/dL (ref 6–20)
CO2: 25 mmol/L (ref 22–32)
Calcium: 8.7 mg/dL — ABNORMAL LOW (ref 8.9–10.3)
Chloride: 97 mmol/L — ABNORMAL LOW (ref 98–111)
Creatinine, Ser: 0.74 mg/dL (ref 0.44–1.00)
GFR calc Af Amer: >60 mL/min (ref >60)
GFR calc non Af Amer: >60 mL/min (ref >60)
Glucose, Bld: 112 mg/dL — ABNORMAL HIGH (ref 70–99)
Potassium: 3.4 mmol/L — ABNORMAL LOW (ref 3.5–5.1)
Sodium: 134 mmol/L — ABNORMAL LOW (ref 135–145)

## 2020-03-19 LAB — BRAIN NATRIURETIC PEPTIDE: B Natriuretic Peptide: 104.1 pg/mL — ABNORMAL HIGH (ref 0.0–100.0)

## 2020-03-19 MED ORDER — FUROSEMIDE 20 MG PO TABS: 20.0000 mg | ORAL_TABLET | Freq: Every day | ORAL | 0 refills | Status: DC

## 2020-03-19 NOTE — ED Notes (Signed)
Patient currently getting dressed then RN will d/c.

## 2020-03-19 NOTE — Progress Notes (Signed)
Bilateral lower extremity venous duplex has been completed. Preliminary results can be found in CV Proc through chart review.  Results were given to [facility].  03/19/20 4:19 PM Carlos Levering RVT

## 2020-03-19 NOTE — ED Triage Notes (Signed)
Pt presents with c/o bilateral leg swelling that started 6 days ago. Pt denies any redness. Pt reports a hx of blood a blood clot in her left leg. Pt denies any shortness of breath or other concerns. Pt reports pain with ambulation.

## 2020-03-19 NOTE — ED Notes (Signed)
HISTORY OF BLOOD CLOTS IN LEGS

## 2020-03-19 NOTE — Telephone Encounter (Signed)
Left VM that legs are swollen and painful. While listening to [facility] sounded short of breath. Called back and got voice mail. Left message to call back asap.

## 2020-03-19 NOTE — Discharge Instructions (Addendum)
Take this Lasix medication for 1 week. It is important for you to follow-up with your primary care provider or a cardiologist for further evaluation and management of your symptoms. Return to the ER if you start to experience worsening chest pain, shortness of breath, leg swelling.

## 2020-03-19 NOTE — ED Provider Notes (Signed)
[facility] DEPT Provider Note   CSN: [id number] Arrival date & time: 03/19/20  1205     History Chief Complaint  Patient presents with  . Leg Swelling    [name] [name] is a 59 y.o. 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  Reports that symptoms began approximately 6 days ago without specific trigger.  Reports gradually worsening leg swelling and pain symmetrically for the past 6 days.  She has been taking her Coumadin as prescribed and has not missed any doses.  She has tried Tylenol with only minimal improvement in her pain.  She has not tried any other interventions.  She denies any chest pain, shortness of breath, hemoptysis, injuries or falls.  HPI     Past Medical History:  Diagnosis Date  . DVT (deep venous thrombosis) ([facility])    left leg    Patient Active Problem List   Diagnosis Date Noted  . Paronychia 10/31/2014  . Iron deficiency anemia 10/24/2011  . Phlebitis and thrombophlebitis 10/24/2011    Past Surgical History:  Procedure Laterality Date  . SPINE SURGERY       OB History   No obstetric history on file.     History reviewed. No pertinent family history.  Social History   Tobacco Use  . Smoking status: Former Smoker  Substance Use Topics  . Alcohol use: Not on file  . Drug use: Not on file    Home Medications Prior to Admission medications   Medication Sig Start Date End Date Taking? Authorizing Provider  acetaminophen (TYLENOL) 325 MG tablet Take 650 mg by mouth every 6 (six) hours as needed.     Yes [provider]  diphenhydramine-acetaminophen (TYLENOL PM) 25-500 MG TABS tablet Take 1 tablet by mouth at bedtime as needed (sleep).   Yes [provider]  warfarin (COUMADIN) 5 MG tablet TAKE 1 TABLET BY MOUTH ONCE DAILY AT 6 PM Patient taking differently: Take 5 mg by mouth one  time only at 4 PM.  02/05/20  Yes Ladell Pier, MD  furosemide (LASIX) 20 MG tablet Take 1 tablet (20 mg total) by mouth daily for 7 days. 03/19/20 03/26/20  Solmon Bohr, Nicanor Alcon, PA-C  oxyCODONE-acetaminophen (PERCOCET) 5-325 MG tablet Take 1 tablet by mouth every 4 (four) hours as needed for moderate pain. Patient not taking: Reported on [id number]   Delora Fuel, MD    Allergies    Patient has no known allergies.  Review of Systems   Review of Systems  Constitutional: Negative for appetite change, chills and fever.  HENT: Negative for ear pain, rhinorrhea, sneezing and sore throat.   Eyes: Negative for photophobia and visual disturbance.  Respiratory: Negative for cough, chest tightness, shortness of breath and wheezing.   Cardiovascular: Positive for leg swelling. Negative for chest pain and palpitations.  Gastrointestinal: Negative for abdominal pain, blood in stool, constipation, diarrhea, nausea and vomiting.  Genitourinary: Negative for dysuria, hematuria and urgency.  Musculoskeletal: Negative for myalgias.  Skin: Negative for rash.  Neurological: Negative for dizziness, weakness and light-headedness.    Physical Exam Updated Vital Signs BP (!) 144/88   Pulse 84   Temp 99.3 F (37.4 C) (Oral)   Resp 15   Ht 5\' 7"  (1.702 m)   Wt 108.9 kg   SpO2 95%   BMI 37.59 kg/m   Physical Exam Vitals and nursing note reviewed.  Constitutional:      General: She is not in acute distress.    Appearance: She is well-developed.  HENT:     Head: Normocephalic and atraumatic.     Nose: Nose normal.  Eyes:     General: No scleral icterus.       Left eye: No discharge.     Conjunctiva/sclera: Conjunctivae normal.  Cardiovascular:     Rate and Rhythm: Normal rate and regular rhythm.     Heart sounds: Normal heart sounds. No murmur. No friction rub. No gallop.   Pulmonary:     Effort: Pulmonary effort is normal. No respiratory distress.     Breath sounds: Normal breath sounds.    Abdominal:     General: Bowel sounds are normal. There is no distension.     Palpations: Abdomen is soft.     Tenderness: There is no abdominal tenderness. There is no guarding.  Musculoskeletal:        General: Swelling and tenderness present. Normal range of motion.     Cervical back: Normal range of motion and neck supple.     Comments: Minimal swelling seen in bilateral lower extremities without pitting edema.  Generally tender.  No overlying skin changes, erythema or warmth.  Skin:    General: Skin is warm and dry.     Findings: No rash.  Neurological:     Mental Status: She is alert.     Motor: No abnormal muscle tone.     Coordination: Coordination normal.     ED Results / Procedures / Treatments   Labs (all labs ordered are listed, but only abnormal results are displayed) Labs Reviewed  BASIC METABOLIC PANEL - Abnormal; Notable for the following components:      Result Value   Sodium 134 (*)    Potassium 3.4 (*)    Chloride 97 (*)    Glucose, Bld 112 (*)    Calcium 8.7 (*)    All other components within normal limits  CBC WITH DIFFERENTIAL/PLATELET - Abnormal; Notable for the following components:   Hemoglobin 11.6 (*)    HCT 35.5 (*)    Abs Immature Granulocytes 0.11 (*)    All other components within normal limits  BRAIN NATRIURETIC PEPTIDE - Abnormal; Notable for the following components:   B Natriuretic Peptide 104.1 (*)    All other components within normal limits    EKG None  Radiology DG Chest 2 View  Result Date: 03/19/2020 CLINICAL DATA:  Leg swelling. Bilateral leg swelling for 6 days. EXAM: CHEST - 2 VIEW COMPARISON:  Radiograph 01/05/2005 FINDINGS: Borderline cardiomegaly. Normal mediastinal contours. Mild vascular congestion without pulmonary edema. No focal airspace disease. No pleural fluid. No pneumothorax. Scoliosis and surgical hardware in the spine. No acute osseous abnormalities are seen. IMPRESSION: Borderline cardiomegaly with mild vascular  congestion. Electronically Signed   By: Keith Rake M.D.   On: 03/19/2020 16:52   VAS [place] LOWER EXTREMITY VENOUS (DVT) (ONLY [facility] & [facility] 7a-7p)  Result Date: 03/19/2020  Lower Venous DVTStudy Indications: Edema.  Risk Factors: None identified. Limitations: Body habitus and poor ultrasound/tissue interface. Comparison Study: No prior studies. Performing Technologist: Oliver Hum RVT  Examination Guidelines: A complete evaluation includes B-mode imaging, spectral Doppler, color Doppler, and power Doppler as needed of all accessible portions of each vessel. Bilateral testing is considered an integral part of a complete examination. Limited examinations for reoccurring indications may be performed as noted. The reflux portion of the exam is performed with the  patient in reverse Trendelenburg.  +---------+---------------+---------+-----------+----------+--------------+ RIGHT    CompressibilityPhasicitySpontaneityPropertiesThrombus Aging +---------+---------------+---------+-----------+----------+--------------+ CFV      Full           Yes      Yes                                 +---------+---------------+---------+-----------+----------+--------------+ SFJ      Full                                                        +---------+---------------+---------+-----------+----------+--------------+ FV Prox  Full                                                        +---------+---------------+---------+-----------+----------+--------------+ FV Mid   Full                                                        +---------+---------------+---------+-----------+----------+--------------+ FV DistalFull                                                        +---------+---------------+---------+-----------+----------+--------------+ PFV      Full                                                        +---------+---------------+---------+-----------+----------+--------------+  POP      Full           Yes      Yes                                 +---------+---------------+---------+-----------+----------+--------------+ PTV      Full                                                        +---------+---------------+---------+-----------+----------+--------------+ PERO     Full                                                        +---------+---------------+---------+-----------+----------+--------------+   +---------+---------------+---------+-----------+----------+--------------+ LEFT     CompressibilityPhasicitySpontaneityPropertiesThrombus Aging +---------+---------------+---------+-----------+----------+--------------+ CFV      Full           Yes      Yes                                 +---------+---------------+---------+-----------+----------+--------------+  SFJ      Full                                                        +---------+---------------+---------+-----------+----------+--------------+ FV Prox  Full                                                        +---------+---------------+---------+-----------+----------+--------------+ FV Mid   Full                                                        +---------+---------------+---------+-----------+----------+--------------+ FV DistalFull                                                        +---------+---------------+---------+-----------+----------+--------------+ PFV      Full                                                        +---------+---------------+---------+-----------+----------+--------------+ POP      Full           Yes      Yes                                 +---------+---------------+---------+-----------+----------+--------------+ PTV      Full                                                        +---------+---------------+---------+-----------+----------+--------------+ PERO     Full                                                         +---------+---------------+---------+-----------+----------+--------------+     Summary: RIGHT: - There is no evidence of deep vein thrombosis in the lower extremity.  - No cystic structure found in the popliteal fossa.  LEFT: - There is no evidence of deep vein thrombosis in the lower extremity.  - No cystic structure found in the popliteal fossa.  *See table(s) above for measurements and observations.    Preliminary     Procedures Procedures (including critical care time)  Medications Ordered in ED Medications - No data to display  ED Course  I have reviewed the triage vital signs and the nursing notes.  Pertinent labs & imaging results that were available during  my care of the patient were reviewed by me and considered in my medical decision making (see chart for details).  Clinical Course as of Mar 19 1821  Thu Mar 19, 2020  1627 Ultrasounds negative for DVT bilaterally.   [HK]  1818 B Natriuretic Peptide(!): 104.1 [HK]    Clinical Course User Index [HK] Delia Heady, PA-C   MDM Rules/Calculators/A&P                      59 year old female with a past medical history of DVT currently anticoagulated on warfarin presents to the ED with a chief complaint of bilateral leg swelling.  Symptoms began approximately 6 days ago without specific trigger.  States that symptoms are symmetric.  Has been taking her Coumadin as prescribed.  Denies any chest pain, shortness of breath or cough.  On exam there is some edema noted of bilateral lower extremities symmetrically with generalized tenderness.  No overlying skin changes, erythema or warmth noted.  No pitting edema noted.  She is speaking complete sentences without difficulty.  Oxygen saturations above 95% on room air.  Ultrasounds are negative for DVT bilaterally.  Chest x-ray shows some mild vascular congestion with cardiomegaly.  Lab work including CBC, BMP unremarkable.  BNP slightly elevated to 104.  Suspect mild  early CHF as a cause of her symptoms.  She does not have any shortness of breath today.  Had a discussion with the patient regarding her diagnosis, feel we can proceed with low-dose of Lasix for 1 week.  I stressed the importance of following up with cardiology and PCP for ongoing work-up and management including an echo.  Patient is agreeable to this plan.  We will have her return for worsening symptoms.  Patient is hemodynamically stable, in NAD, and able to ambulate in the ED. Evaluation does not show pathology that would require ongoing emergent intervention or inpatient treatment. I have personally reviewed and interpreted all lab work and imaging at today's ED visit. I explained the diagnosis to the patient. Pain has been managed and has no complaints prior to discharge. Patient is comfortable with above plan and is stable for discharge at this time. All questions were answered prior to disposition. Strict return precautions for returning to the ED were discussed. Encouraged follow up with PCP.   An After Visit Summary was printed and given to the patient.   Portions of this note were generated with Lobbyist. Dictation errors may occur despite best attempts at proofreading.  Final Clinical Impression(s) / ED Diagnoses Final diagnoses:  Peripheral edema  Pulmonary vascular congestion    Rx / DC Orders ED Discharge Orders         Ordered    furosemide (LASIX) 20 MG tablet  Daily     03/19/20 1819           Delia Heady, PA-C 03/19/20 1822    Tegeler, Gwenyth Allegra, MD 03/19/20 2200

## 2020-03-19 NOTE — ED Notes (Signed)
Pt provided discharge instructions and verbalized understanding of medications and follow up care.

## 2020-03-20 ENCOUNTER — Telehealth: Payer: Self-pay | Admitting: Cardiology

## 2020-03-20 ENCOUNTER — Other Ambulatory Visit: Payer: Self-pay | Admitting: *Deleted

## 2020-03-20 ENCOUNTER — Telehealth: Payer: Self-pay | Admitting: *Deleted

## 2020-03-20 DIAGNOSIS — I82402 Acute embolism and thrombosis of unspecified deep veins of left lower extremity: Secondary | ICD-10-CM

## 2020-03-20 NOTE — Telephone Encounter (Signed)
Called back to report the ER determined she has early CHF and not DVT and put her on low dose Lasix. She is calling to f/u with Dr.Meyers next week. She confirms lab appointment for 03/23/20 here for INR.

## 2020-03-20 NOTE — Telephone Encounter (Signed)
New message  Patient is wanting to get husband Dominica Severin approved to accompany her to her new patient appointment on 03/25/20 with Dr. Marlou Porch. Please give patient a call back to confirm.

## 2020-03-23 ENCOUNTER — Other Ambulatory Visit: Payer: Self-pay

## 2020-03-23 ENCOUNTER — Inpatient Hospital Stay: Payer: Managed Care, Other (non HMO) | Attending: Oncology

## 2020-03-23 ENCOUNTER — Telehealth: Payer: Self-pay | Admitting: *Deleted

## 2020-03-23 DIAGNOSIS — I82402 Acute embolism and thrombosis of unspecified deep veins of left lower extremity: Secondary | ICD-10-CM | POA: Insufficient documentation

## 2020-03-23 LAB — PROTIME-INR
INR: 2 — ABNORMAL HIGH (ref 0.8–1.2)
Prothrombin Time: 22.8 seconds — ABNORMAL HIGH (ref 11.4–15.2)

## 2020-03-23 NOTE — Telephone Encounter (Signed)
There seems to be a special need for her husband to be at her appointment.  Please allow him to come up as well. [name] Furbish, MD

## 2020-03-23 NOTE — Telephone Encounter (Signed)
-----   Message from Ladell Pier, MD sent at 03/23/2020  1:17 PM EDT ----- Please call patient, same coumadin, repeat PT INR 1 month

## 2020-03-23 NOTE — Telephone Encounter (Signed)
Notified of INR 2.0. Continue same warfarin 5 mg qd (see flowsheet). Lab 5/24 as scheduled.

## 2020-03-24 ENCOUNTER — Encounter: Payer: Self-pay | Admitting: Cardiology

## 2020-03-24 NOTE — Telephone Encounter (Signed)
Error

## 2020-03-25 ENCOUNTER — Other Ambulatory Visit: Payer: Self-pay

## 2020-03-25 ENCOUNTER — Ambulatory Visit: Payer: Managed Care, Other (non HMO) | Admitting: Cardiology

## 2020-03-25 ENCOUNTER — Encounter: Payer: Self-pay | Admitting: Cardiology

## 2020-03-25 VITALS — BP 140/90 | HR 113 | Ht 67.0 in | Wt 219.8 lb

## 2020-03-25 DIAGNOSIS — I517 Cardiomegaly: Secondary | ICD-10-CM

## 2020-03-25 DIAGNOSIS — R9431 Abnormal electrocardiogram [ECG] [EKG]: Secondary | ICD-10-CM | POA: Diagnosis not present

## 2020-03-25 NOTE — Progress Notes (Signed)
Cardiology Office Note:    Date:  03/25/2020   ID:  [name] [name], DOB 09/26/1961, MRN [id number]  PCP:  Orpah Melter, MD  Cardiologist:  No primary care provider on file.  Electrophysiologist:  None   Referring MD: Orpah Melter, MD     History of Present Illness:    [name] [name] is a 59 y.o. female for the evaluation of lower extremity edema at the request of Dr. Orpah Melter.  She has a past history of left lower extremity DVT anticoagulated with warfarin currently who went to the emergency room on 03/19/2020 with bilateral leg swelling.  It started about 6 days prior to this ER visit and gradually got worse and was quite symmetrical with pain involved.  She had not missed any Coumadin.  Tylenol only helped minimally.  No chest pain no shortness of breath no hemoptysis no injuries no falls.  BNP was mildly elevated at 104.1.  Ultrasound was negative for DVT bilaterally.  Oxygen sat 95% room air.  It shows some mild cardiomegaly and vascular congestion.  She was given low-dose Lasix 20 mg for a week.  [place] home.   Legs hurt on floors all day. Could hard to move from car to house.   Past Medical History:  Diagnosis Date  . DVT (deep venous thrombosis) ([facility])    left leg    Past Surgical History:  Procedure Laterality Date  . SPINE SURGERY      Current Medications: Current Meds  Medication Sig  . acetaminophen (TYLENOL) 325 MG tablet Take 650 mg by mouth every 6 (six) hours as needed.    . diphenhydramine-acetaminophen (TYLENOL PM) 25-500 MG TABS tablet Take 1 tablet by mouth at bedtime as needed (sleep).  . furosemide (LASIX) 20 MG tablet Take 1 tablet (20 mg total) by mouth daily for 7 days.  Marland Kitchen warfarin (COUMADIN) 5 MG tablet TAKE 1 TABLET BY MOUTH ONCE DAILY AT 6 PM (Patient taking differently: Take 5 mg by mouth one time only at 4 PM. )     Allergies:   Patient has no known allergies.   Social History   Socioeconomic History  . Marital  status: Married    Spouse name: Not on file  . Number of children: Not on file  . Years of education: Not on file  . Highest education level: Not on file  Occupational History  . Not on file  Tobacco Use  . Smoking status: Former Research scientist (life sciences)  . Smokeless tobacco: Former Network engineer and Sexual Activity  . Alcohol use: Never    Alcohol/week: 0.0 standard drinks  . Drug use: Never  . Sexual activity: Not Currently  Other Topics Concern  . Not on file  Social History Narrative  . Not on file   Social Determinants of Health   Financial Resource Strain:   . Difficulty of Paying Living Expenses:   Food Insecurity:   . Worried About Charity fundraiser in the Last Year:   . Arboriculturist in the Last Year:   Transportation Needs:   . Film/video editor (Medical):   Marland Kitchen Lack of Transportation (Non-Medical):   Physical Activity:   . Days of Exercise per Week:   . Minutes of Exercise per Session:   Stress:   . Feeling of Stress :   Social Connections:   . Frequency of Communication with Friends and Family:   . Frequency of Social Gatherings with Friends and Family:   .  Attends Religious Services:   . Active Member of Clubs or Organizations:   . Attends Archivist Meetings:   Marland Kitchen Marital Status:      Family History: The patient's family history includes Cancer in her father; Lung cancer in her mother.  ROS:   Please see the history of present illness.     All other systems reviewed and are negative.  EKGs/Labs/Other Studies Reviewed:    The following studies were reviewed today: Chest x-ray ER notes  EKG:  EKG is  ordered today.  The ekg ordered today demonstrates sinus tachycardia 113 baseline artifact no other specific abnormalities  Recent Labs: 03/19/2020: B Natriuretic Peptide 104.1; BUN 18; Creatinine, Ser 0.74; Hemoglobin 11.6; Platelets 375; Potassium 3.4; Sodium 134  Recent Lipid Panel No results found for: CHOL, TRIG, HDL, CHOLHDL, VLDL, LDLCALC,  LDLDIRECT  Physical Exam:    VS:  BP 140/90   Pulse (!) 113   Ht 5\' 7"  (1.702 m)   Wt 219 lb 12.8 oz (99.7 kg)   SpO2 98%   BMI 34.43 kg/m     Wt Readings from Last 3 Encounters:  03/25/20 219 lb 12.8 oz (99.7 kg)  03/19/20 240 lb (108.9 kg)  08/26/19 219 lb (99.3 kg)     GEN:  Well nourished, well developed in no acute distress HEENT: Normal NECK: No JVD; No carotid bruits LYMPHATICS: No lymphadenopathy CARDIAC: RRR, no murmurs, rubs, gallops RESPIRATORY:  Clear to auscultation without rales, wheezing or rhonchi  ABDOMEN: Soft, non-tender, non-distended MUSCULOSKELETAL:  No edema; No deformity  SKIN: Warm and dry NEUROLOGIC:  Alert and oriented x 3 PSYCHIATRIC:  Normal affect   ASSESSMENT:    1. Abnormal EKG   2. Cardiomegaly    PLAN:    In order of problems listed above:  Cardiomegaly noted on chest x-ray, lower extremity edema, mild tachycardia, mild BNP -We will go ahead and check an echocardiogram.  Chest x-ray did show mild cardiomegaly and vascular congestion. I want make sure that she does not have an underlying cardiomyopathy.  Lower extremity edema -Resolved with 20 mg of Lasix over the last few days.  Excellent.  Shin bone is palpable on exam. -No evidence of thrombosis.  Ultrasound normal.  Leg pain bilaterally -Seems to be having leg discomfort when having a long day at work, on the hard floors, stairs.  This could be more mechanical related, she was perhaps, perhaps neuropathy.  I was able to palpate very well distal pedal pulses.  Does not appear to be peripheral vascular disease.  I do not think I can correlate this with her heart.  She will discuss further with Dr. Doyle Askew.  Discussed Covid vaccine with them, she and her husband, they are not interested at this time.   Medication Adjustments/Labs and Tests Ordered: Current medicines are reviewed at length with the patient today.  Concerns regarding medicines are outlined above.  Orders Placed This  Encounter  Procedures  . EKG 12-Lead  . ECHOCARDIOGRAM COMPLETE   No orders of the defined types were placed in this encounter.   Patient Instructions  Medication Instructions:  The current medical regimen is effective;  continue present plan and medications.  *If you need a refill on your cardiac medications before your next appointment, please call your pharmacy*  Testing/Procedures: Your physician has requested that you have an echocardiogram. Echocardiography is a painless test that uses sound waves to create images of your heart. It provides your doctor with information about the size  and shape of your heart and how well your heart's chambers and valves are working. This procedure takes approximately one hour. There are no restrictions for this procedure.  Follow-Up: Follow up as needed after the above testing.  Thank you for choosing [facility]!!        Signed, Candee Furbish, MD  03/25/2020 4:14 PM    [facility]

## 2020-03-25 NOTE — Patient Instructions (Signed)
Medication Instructions:  The current medical regimen is effective;  continue present plan and medications.  *If you need a refill on your cardiac medications before your next appointment, please call your pharmacy*  Testing/Procedures: Your physician has requested that you have an echocardiogram. Echocardiography is a painless test that uses sound waves to create images of your heart. It provides your doctor with information about the size and shape of your heart and how well your heart's chambers and valves are working. This procedure takes approximately one hour. There are no restrictions for this procedure.  Follow-Up: Follow up as needed after the above testing.  Thank you for choosing [facility]!!

## 2020-04-10 ENCOUNTER — Ambulatory Visit (HOSPITAL_COMMUNITY): Payer: Managed Care, Other (non HMO) | Attending: Internal Medicine

## 2020-04-10 ENCOUNTER — Other Ambulatory Visit: Payer: Self-pay

## 2020-04-10 DIAGNOSIS — R9431 Abnormal electrocardiogram [ECG] [EKG]: Secondary | ICD-10-CM | POA: Diagnosis not present

## 2020-04-10 DIAGNOSIS — I517 Cardiomegaly: Secondary | ICD-10-CM | POA: Insufficient documentation

## 2020-04-10 NOTE — Progress Notes (Signed)
Patient did not consent to the use of Definity at this time.

## 2020-05-01 ENCOUNTER — Other Ambulatory Visit: Payer: Self-pay | Admitting: *Deleted

## 2020-05-01 DIAGNOSIS — I82402 Acute embolism and thrombosis of unspecified deep veins of left lower extremity: Secondary | ICD-10-CM

## 2020-05-04 ENCOUNTER — Other Ambulatory Visit: Payer: Self-pay

## 2020-05-04 ENCOUNTER — Telehealth: Payer: Self-pay

## 2020-05-04 ENCOUNTER — Inpatient Hospital Stay: Payer: Managed Care, Other (non HMO) | Attending: Oncology

## 2020-05-04 DIAGNOSIS — I82402 Acute embolism and thrombosis of unspecified deep veins of left lower extremity: Secondary | ICD-10-CM

## 2020-05-04 LAB — PROTIME-INR
INR: 1.5 — ABNORMAL HIGH (ref 0.8–1.2)
Prothrombin Time: 17.5 seconds — ABNORMAL HIGH (ref 11.4–15.2)

## 2020-05-04 NOTE — Telephone Encounter (Signed)
Left voicemail for patient to call back [facility] 

## 2020-05-05 ENCOUNTER — Telehealth: Payer: Self-pay | Admitting: *Deleted

## 2020-05-05 ENCOUNTER — Other Ambulatory Visit: Payer: Self-pay | Admitting: Oncology

## 2020-05-05 DIAGNOSIS — I82402 Acute embolism and thrombosis of unspecified deep veins of left lower extremity: Secondary | ICD-10-CM

## 2020-05-05 NOTE — Telephone Encounter (Signed)
-----   Message from Ladell Pier, MD sent at 05/04/2020  1:54 PM EDT ----- Please call patient, has she missed any doses of Coumadin, if not-continue same dose, repeat PT 1 month

## 2020-05-05 NOTE — Telephone Encounter (Signed)
Notified of INR--denies any diet changes or med changes except she took oral Lasix for ~ 1 month. Has not missed any doses. Instructed to continue same dose of warfarin 5 mg daily (flowsheet). Will recheck in 1 month.

## 2020-06-08 ENCOUNTER — Inpatient Hospital Stay: Payer: Managed Care, Other (non HMO)

## 2020-06-09 ENCOUNTER — Other Ambulatory Visit: Payer: Self-pay

## 2020-06-09 ENCOUNTER — Inpatient Hospital Stay: Payer: Managed Care, Other (non HMO) | Attending: Oncology

## 2020-06-09 DIAGNOSIS — I82402 Acute embolism and thrombosis of unspecified deep veins of left lower extremity: Secondary | ICD-10-CM | POA: Diagnosis present

## 2020-06-09 LAB — PROTIME-INR
INR: 1.5 — ABNORMAL HIGH (ref 0.8–1.2)
Prothrombin Time: 17.3 seconds — ABNORMAL HIGH (ref 11.4–15.2)

## 2020-06-10 ENCOUNTER — Telehealth: Payer: Self-pay | Admitting: *Deleted

## 2020-06-10 NOTE — Telephone Encounter (Signed)
Left VM to return call to discuss PT/INR. LOS sent to schedule lab in 1 month.

## 2020-06-10 NOTE — Telephone Encounter (Signed)
-----   Message from Ladell Pier, MD sent at 06/09/2020 10:50 AM EDT ----- Please call patient, if she has not missed any doses of Coumadin then increase the Coumadin to 7.5 mg on Monday and Thursday and 5 mg other days, repeat PT/INR in 1 month

## 2020-06-11 ENCOUNTER — Telehealth: Payer: Self-pay | Admitting: Oncology

## 2020-06-11 ENCOUNTER — Telehealth: Payer: Self-pay | Admitting: *Deleted

## 2020-06-11 NOTE — Telephone Encounter (Signed)
Scheduled appt per 6/30 sch message - unable to reach pt.left message for patient with appt date and time

## 2020-06-11 NOTE — Telephone Encounter (Signed)
Pt returned Susan's call from yesterday.  Informed pt to increased coumadin to 7.5 mg or 1 & 1/2 pill on Mondays & Thursdays & cont 5 mg daily other days & repeat PT/INR in one month.  She expressed understanding. Message to schedulers to schedule lab in 1 month.

## 2020-06-16 ENCOUNTER — Other Ambulatory Visit: Payer: Managed Care, Other (non HMO)

## 2020-07-07 ENCOUNTER — Inpatient Hospital Stay: Payer: Managed Care, Other (non HMO) | Attending: Oncology

## 2020-07-07 ENCOUNTER — Telehealth: Payer: Self-pay | Admitting: *Deleted

## 2020-07-07 ENCOUNTER — Other Ambulatory Visit: Payer: Self-pay

## 2020-07-07 DIAGNOSIS — I82402 Acute embolism and thrombosis of unspecified deep veins of left lower extremity: Secondary | ICD-10-CM | POA: Insufficient documentation

## 2020-07-07 LAB — PROTIME-INR
INR: 1.9 — ABNORMAL HIGH (ref 0.8–1.2)
Prothrombin Time: 20.9 seconds — ABNORMAL HIGH (ref 11.4–15.2)

## 2020-07-07 NOTE — Telephone Encounter (Signed)
-----   Message from Ladell Pier, MD sent at 07/07/2020  2:06 PM EDT ----- Please call patient, PT/INR is at low end of therapeutic range, continue Coumadin at the same dose, repeat PT/INR in 1 month

## 2020-07-07 NOTE — Telephone Encounter (Signed)
Notified of INR 1.9. Continue coumadin 5 mg daily, except 7.5 mg Monday & Thursday. Will recheck on 08/03/20.

## 2020-07-10 ENCOUNTER — Other Ambulatory Visit: Payer: Managed Care, Other (non HMO)

## 2020-07-27 ENCOUNTER — Other Ambulatory Visit: Payer: Managed Care, Other (non HMO)

## 2020-07-30 ENCOUNTER — Other Ambulatory Visit: Payer: Self-pay | Admitting: Oncology

## 2020-08-03 ENCOUNTER — Inpatient Hospital Stay: Payer: Managed Care, Other (non HMO) | Attending: Oncology

## 2020-08-03 ENCOUNTER — Telehealth: Payer: Self-pay | Admitting: *Deleted

## 2020-08-03 ENCOUNTER — Other Ambulatory Visit: Payer: Self-pay

## 2020-08-03 DIAGNOSIS — I82402 Acute embolism and thrombosis of unspecified deep veins of left lower extremity: Secondary | ICD-10-CM | POA: Insufficient documentation

## 2020-08-03 LAB — PROTIME-INR
INR: 1.9 — ABNORMAL HIGH (ref 0.8–1.2)
Prothrombin Time: 21.1 seconds — ABNORMAL HIGH (ref 11.4–15.2)

## 2020-08-03 NOTE — Telephone Encounter (Signed)
Unable to reach patient re: INR results. She works 2nd shift. Will call in am.

## 2020-08-04 NOTE — Telephone Encounter (Signed)
Left VM w/INR result and continue Coumadin 5 mg daily, except 7.5 mg Mon & Thurs. Return 9/27 as scheduled.

## 2020-09-04 ENCOUNTER — Other Ambulatory Visit: Payer: Self-pay | Admitting: *Deleted

## 2020-09-04 DIAGNOSIS — I82402 Acute embolism and thrombosis of unspecified deep veins of left lower extremity: Secondary | ICD-10-CM

## 2020-09-07 ENCOUNTER — Telehealth: Payer: Self-pay | Admitting: Oncology

## 2020-09-07 ENCOUNTER — Other Ambulatory Visit: Payer: Self-pay

## 2020-09-07 ENCOUNTER — Inpatient Hospital Stay: Payer: Managed Care, Other (non HMO) | Attending: Oncology

## 2020-09-07 ENCOUNTER — Inpatient Hospital Stay (HOSPITAL_BASED_OUTPATIENT_CLINIC_OR_DEPARTMENT_OTHER): Payer: Managed Care, Other (non HMO) | Admitting: Oncology

## 2020-09-07 VITALS — BP 136/69 | HR 65 | Temp 96.8°F | Resp 17 | Ht 67.0 in | Wt 231.5 lb

## 2020-09-07 DIAGNOSIS — N920 Excessive and frequent menstruation with regular cycle: Secondary | ICD-10-CM | POA: Diagnosis not present

## 2020-09-07 DIAGNOSIS — I82402 Acute embolism and thrombosis of unspecified deep veins of left lower extremity: Secondary | ICD-10-CM

## 2020-09-07 DIAGNOSIS — Z7901 Long term (current) use of anticoagulants: Secondary | ICD-10-CM | POA: Diagnosis not present

## 2020-09-07 DIAGNOSIS — G8929 Other chronic pain: Secondary | ICD-10-CM | POA: Insufficient documentation

## 2020-09-07 DIAGNOSIS — Z86718 Personal history of other venous thrombosis and embolism: Secondary | ICD-10-CM | POA: Diagnosis present

## 2020-09-07 DIAGNOSIS — M79605 Pain in left leg: Secondary | ICD-10-CM | POA: Diagnosis not present

## 2020-09-07 DIAGNOSIS — D6862 Lupus anticoagulant syndrome: Secondary | ICD-10-CM | POA: Insufficient documentation

## 2020-09-07 DIAGNOSIS — M79604 Pain in right leg: Secondary | ICD-10-CM | POA: Insufficient documentation

## 2020-09-07 DIAGNOSIS — D500 Iron deficiency anemia secondary to blood loss (chronic): Secondary | ICD-10-CM | POA: Diagnosis not present

## 2020-09-07 LAB — PROTIME-INR
INR: 2 — ABNORMAL HIGH (ref 0.8–1.2)
Prothrombin Time: 22.2 seconds — ABNORMAL HIGH (ref 11.4–15.2)

## 2020-09-07 NOTE — Progress Notes (Signed)
  [facility] OFFICE PROGRESS NOTE   Diagnosis: History of venous thrombosis, Coumadin anticoagulation  INTERVAL HISTORY:   [name] [name] returns as scheduled. She continues Coumadin anticoagulation. No bleeding or symptom of thrombosis. No complaint. She has received the first dose of the COVID-19 vaccine.  Objective:  Vital signs in last 24 hours:  Blood pressure 136/69, pulse 65, temperature (!) 96.8 F (36 C), temperature source Tympanic, resp. rate 17, height 5\' 7"  (1.702 m), weight 231 lb 8 oz (105 kg), SpO2 100 %.    Resp: Lungs clear bilaterally Cardio: Regular rate and rhythm GI: No hepatosplenomegaly Vascular: No leg edema    Lab Results:  Lab Results  Component Value Date   WBC 7.8 03/19/2020   HGB 11.6 (L) 03/19/2020   HCT 35.5 (L) 03/19/2020   MCV 90.3 03/19/2020   PLT 375 03/19/2020   NEUTROABS 5.3 03/19/2020    CMP  Lab Results  Component Value Date   NA 134 (L) 03/19/2020   K 3.4 (L) 03/19/2020   CL 97 (L) 03/19/2020   CO2 25 03/19/2020   GLUCOSE 112 (H) 03/19/2020   BUN 18 03/19/2020   CREATININE 0.74 03/19/2020   CALCIUM 8.7 (L) 03/19/2020   PROT 8.1 01/07/2016   ALBUMIN 4.5 01/07/2016   AST 29 01/07/2016   ALT 33 01/07/2016   ALKPHOS 88 01/07/2016   BILITOT 0.5 01/07/2016   GFRNONAA >60 03/19/2020   GFRAA >60 03/19/2020    Medications: I have reviewed the patient's current medications.   Assessment/Plan: 1. 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 2. 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 Repeat lupus and anticardiolipin antibody testing was negative until there was a positive lupus anticoagulant panel on 05/27/2008. There was a positive lupus anticoagulant and hexagonal phase study on 10/27/2009. She would like to continue Coumadin anticoagulation.  3. History ofAnemia secondary to menorrhagia and iron deficiency.She  is now amenorrheic. 4. Chronic bilateral leg pain.    Disposition: [name] [name] appears stable. The PT/INR is therapeutic. She would like to continue Coumadin. She will return for a monthly PT/INR. She will return for an office visit in 1 year. She will contact us for bleeding or symptoms of recurrent venous thrombosis.  Betsy Coder, MD  09/07/2020  9:13 AM

## 2020-09-07 NOTE — Telephone Encounter (Signed)
Scheduled per los. Gave avs and calendar  

## 2020-10-07 ENCOUNTER — Inpatient Hospital Stay: Payer: Managed Care, Other (non HMO) | Attending: Oncology

## 2020-10-07 ENCOUNTER — Other Ambulatory Visit: Payer: Self-pay

## 2020-10-07 DIAGNOSIS — I82402 Acute embolism and thrombosis of unspecified deep veins of left lower extremity: Secondary | ICD-10-CM | POA: Diagnosis present

## 2020-10-07 LAB — PROTIME-INR
INR: 2.6 — ABNORMAL HIGH (ref 0.8–1.2)
Prothrombin Time: 27.2 seconds — ABNORMAL HIGH (ref 11.4–15.2)

## 2020-10-08 ENCOUNTER — Telehealth: Payer: Self-pay | Admitting: *Deleted

## 2020-10-08 DIAGNOSIS — I82402 Acute embolism and thrombosis of unspecified deep veins of left lower extremity: Secondary | ICD-10-CM

## 2020-10-08 NOTE — Telephone Encounter (Signed)
Left VM with INR results and to continue warfarin 5 mg daily, except 7.5 mg on Mon & Thurs. Recheck in 1 month. Scheduling notified.

## 2020-10-09 ENCOUNTER — Telehealth: Payer: Self-pay | Admitting: Oncology

## 2020-10-09 NOTE — Telephone Encounter (Signed)
Scheduled apt per 10/28 sch msg - unable to reach pt . Left message for patient with appt date and time

## 2020-10-26 ENCOUNTER — Other Ambulatory Visit: Payer: Self-pay | Admitting: Oncology

## 2020-11-02 ENCOUNTER — Other Ambulatory Visit: Payer: Self-pay

## 2020-11-02 ENCOUNTER — Inpatient Hospital Stay: Payer: Managed Care, Other (non HMO) | Attending: Oncology

## 2020-11-02 DIAGNOSIS — I82402 Acute embolism and thrombosis of unspecified deep veins of left lower extremity: Secondary | ICD-10-CM | POA: Diagnosis not present

## 2020-11-02 LAB — PROTIME-INR
INR: 1.6 — ABNORMAL HIGH (ref 0.8–1.2)
Prothrombin Time: 18.9 seconds — ABNORMAL HIGH (ref 11.4–15.2)

## 2020-11-03 ENCOUNTER — Telehealth: Payer: Self-pay | Admitting: *Deleted

## 2020-11-03 NOTE — Telephone Encounter (Signed)
-----   Message from Ladell Pier, MD sent at 11/02/2020  5:43 PM EST ----- Please call patient, the PT/INR is low, has she missed doses, any new medications?

## 2020-11-03 NOTE — Telephone Encounter (Addendum)
Left VM with low INR 1.6 and requested return call when she gets home from work regarding any missed doses or new meds or changes in diet? Current dose is 5 mg qd, 7.5 mg on Monday and Thursday. She reports no missed doses or new meds. Per Dr. Theone Murdoch same dose and if still low in December will increase her dosage. She understands and agrees.

## 2020-12-02 ENCOUNTER — Inpatient Hospital Stay: Payer: Managed Care, Other (non HMO) | Attending: Oncology

## 2020-12-02 ENCOUNTER — Other Ambulatory Visit: Payer: Self-pay

## 2020-12-02 DIAGNOSIS — I82402 Acute embolism and thrombosis of unspecified deep veins of left lower extremity: Secondary | ICD-10-CM | POA: Diagnosis present

## 2020-12-02 LAB — PROTIME-INR
INR: 1.8 — ABNORMAL HIGH (ref 0.8–1.2)
Prothrombin Time: 19.8 seconds — ABNORMAL HIGH (ref 11.4–15.2)

## 2020-12-03 ENCOUNTER — Telehealth: Payer: Self-pay | Admitting: *Deleted

## 2020-12-03 ENCOUNTER — Telehealth: Payer: Self-pay | Admitting: Oncology

## 2020-12-03 DIAGNOSIS — I82402 Acute embolism and thrombosis of unspecified deep veins of left lower extremity: Secondary | ICD-10-CM

## 2020-12-03 NOTE — Telephone Encounter (Signed)
Left VM with INR 1.8. continue warfarin at 5 mg qd and 7.5 mg on M & Th. Repeat lab in 1 month. Scheduling message sent.

## 2020-12-03 NOTE — Telephone Encounter (Signed)
Scheduled appt per 12/23 sch  msg - unable to reach pt . Left message for pt with appt date and itme

## 2020-12-03 NOTE — Telephone Encounter (Signed)
-----   Message from Ladell Pier, MD sent at 12/02/2020  3:21 PM EST ----- Please call patient, same Coumadin, repeat PT/INR in 1 month

## 2021-01-04 ENCOUNTER — Other Ambulatory Visit: Payer: Managed Care, Other (non HMO)

## 2021-01-06 ENCOUNTER — Other Ambulatory Visit: Payer: Self-pay

## 2021-01-06 ENCOUNTER — Inpatient Hospital Stay: Payer: Managed Care, Other (non HMO) | Attending: Oncology

## 2021-01-06 DIAGNOSIS — I82402 Acute embolism and thrombosis of unspecified deep veins of left lower extremity: Secondary | ICD-10-CM | POA: Diagnosis present

## 2021-01-06 LAB — PROTIME-INR
INR: 2.8 — ABNORMAL HIGH (ref 0.8–1.2)
Prothrombin Time: 28.6 seconds — ABNORMAL HIGH (ref 11.4–15.2)

## 2021-01-07 ENCOUNTER — Other Ambulatory Visit: Payer: Self-pay

## 2021-01-07 ENCOUNTER — Telehealth: Payer: Self-pay

## 2021-01-07 NOTE — Telephone Encounter (Signed)
Per pt request left voicemail: Continue current coumadin dose/ 5 mg tablet po daily except on Monday and Thursday take 1 and 1/2 tablets.  To have a PT/INR in 1 month. Asked pt to contact office if her current coumadin dose is different from what we have.

## 2021-02-02 ENCOUNTER — Other Ambulatory Visit: Payer: Self-pay | Admitting: Oncology

## 2021-02-03 ENCOUNTER — Inpatient Hospital Stay: Payer: Managed Care, Other (non HMO)

## 2021-02-04 ENCOUNTER — Other Ambulatory Visit: Payer: Managed Care, Other (non HMO)

## 2021-02-10 ENCOUNTER — Other Ambulatory Visit: Payer: Self-pay

## 2021-02-10 ENCOUNTER — Inpatient Hospital Stay: Payer: Managed Care, Other (non HMO) | Attending: Oncology

## 2021-02-10 DIAGNOSIS — I82402 Acute embolism and thrombosis of unspecified deep veins of left lower extremity: Secondary | ICD-10-CM | POA: Insufficient documentation

## 2021-02-10 LAB — PROTIME-INR
INR: 2.7 — ABNORMAL HIGH (ref 0.8–1.2)
Prothrombin Time: 27.7 seconds — ABNORMAL HIGH (ref 11.4–15.2)

## 2021-02-11 ENCOUNTER — Telehealth: Payer: Self-pay

## 2021-02-11 NOTE — Telephone Encounter (Signed)
Called message left to return call to review most recent PT/INR results coumadin same dose and recheck x 1 month will monitor

## 2021-02-11 NOTE — Telephone Encounter (Signed)
-----   Message from [name] Pier, MD sent at 02/10/2021  7:31 PM EST ----- Please call patient, continue coumadin same dose, check PTINR 1 month

## 2021-03-01 ENCOUNTER — Other Ambulatory Visit: Payer: Self-pay | Admitting: Nurse Practitioner

## 2021-03-01 DIAGNOSIS — I82402 Acute embolism and thrombosis of unspecified deep veins of left lower extremity: Secondary | ICD-10-CM

## 2021-03-03 ENCOUNTER — Other Ambulatory Visit: Payer: Self-pay

## 2021-03-03 ENCOUNTER — Inpatient Hospital Stay: Payer: Managed Care, Other (non HMO)

## 2021-03-03 DIAGNOSIS — I82402 Acute embolism and thrombosis of unspecified deep veins of left lower extremity: Secondary | ICD-10-CM

## 2021-03-03 LAB — PROTIME-INR
INR: 2 — ABNORMAL HIGH (ref 0.8–1.2)
Prothrombin Time: 22.1 seconds — ABNORMAL HIGH (ref 11.4–15.2)

## 2021-03-04 ENCOUNTER — Other Ambulatory Visit: Payer: Managed Care, Other (non HMO)

## 2021-03-04 ENCOUNTER — Telehealth: Payer: Self-pay

## 2021-03-04 NOTE — Telephone Encounter (Signed)
Left message with results of most recent lab results pt/inr continue coumadin at the same dose repeat lab x 1 month

## 2021-03-04 NOTE — Telephone Encounter (Signed)
-----   Message from Owens Shark, NP sent at 03/04/2021  3:31 PM EDT ----- Please let her know to continue Coumadin at the current dose.  Next PT/INR in 1 month.

## 2021-04-24 ENCOUNTER — Other Ambulatory Visit: Payer: Self-pay | Admitting: Oncology

## 2021-04-28 ENCOUNTER — Other Ambulatory Visit: Payer: Self-pay

## 2021-04-28 ENCOUNTER — Inpatient Hospital Stay: Payer: Managed Care, Other (non HMO) | Attending: Oncology

## 2021-04-28 DIAGNOSIS — I82402 Acute embolism and thrombosis of unspecified deep veins of left lower extremity: Secondary | ICD-10-CM | POA: Diagnosis not present

## 2021-04-28 LAB — PROTIME-INR
INR: 2.3 — ABNORMAL HIGH (ref 0.8–1.2)
Prothrombin Time: 25.4 seconds — ABNORMAL HIGH (ref 11.4–15.2)

## 2021-04-29 ENCOUNTER — Telehealth: Payer: Self-pay

## 2021-04-29 NOTE — Telephone Encounter (Signed)
-----   Message from Ladell Pier, MD sent at 04/28/2021  1:23 PM EDT ----- Please call patient, continue same Coumadin dose, repeat PT/INR in 4-6 weeks

## 2021-04-29 NOTE — Telephone Encounter (Signed)
V/M message left for Pt with message to continue same coumadin dose and repeat labs in 4-6 weeks informed to return with any questions, problems or concerns.

## 2021-06-02 ENCOUNTER — Other Ambulatory Visit: Payer: Self-pay

## 2021-06-02 ENCOUNTER — Inpatient Hospital Stay: Payer: Managed Care, Other (non HMO) | Attending: Oncology

## 2021-06-02 ENCOUNTER — Other Ambulatory Visit: Payer: Self-pay | Admitting: *Deleted

## 2021-06-02 DIAGNOSIS — I82402 Acute embolism and thrombosis of unspecified deep veins of left lower extremity: Secondary | ICD-10-CM | POA: Insufficient documentation

## 2021-06-02 LAB — PROTIME-INR
INR: 3.1 — ABNORMAL HIGH (ref 0.8–1.2)
Prothrombin Time: 32.1 seconds — ABNORMAL HIGH (ref 11.4–15.2)

## 2021-06-04 ENCOUNTER — Telehealth: Payer: Self-pay | Admitting: *Deleted

## 2021-06-04 DIAGNOSIS — I82402 Acute embolism and thrombosis of unspecified deep veins of left lower extremity: Secondary | ICD-10-CM

## 2021-06-04 NOTE — Telephone Encounter (Signed)
-----   Message from Ladell Pier, MD sent at 06/02/2021  1:34 PM EDT ----- Please call patient, same Coumadin, check PT/INR in 1 month

## 2021-06-04 NOTE — Telephone Encounter (Signed)
Confirmed with patient that she is taking Coumadin 5 mg daily, except 7.5 mg on M & Th. She will return in 1 month for lab. Scheduling message sent.

## 2021-06-04 NOTE — Telephone Encounter (Signed)
Left VM for patient to call office to go over PT results. Attempted to reach her at work number and they could not locate employee by that name.

## 2021-06-30 ENCOUNTER — Inpatient Hospital Stay: Payer: Managed Care, Other (non HMO)

## 2021-07-02 ENCOUNTER — Other Ambulatory Visit: Payer: Managed Care, Other (non HMO)

## 2021-07-07 ENCOUNTER — Other Ambulatory Visit: Payer: Self-pay

## 2021-07-07 ENCOUNTER — Telehealth: Payer: Self-pay

## 2021-07-07 ENCOUNTER — Inpatient Hospital Stay: Payer: Managed Care, Other (non HMO) | Attending: Oncology

## 2021-07-07 DIAGNOSIS — I82402 Acute embolism and thrombosis of unspecified deep veins of left lower extremity: Secondary | ICD-10-CM | POA: Diagnosis present

## 2021-07-07 LAB — PROTIME-INR
INR: 4.1 (ref 0.8–1.2)
Prothrombin Time: 39.5 seconds — ABNORMAL HIGH (ref 11.4–15.2)

## 2021-07-07 NOTE — Telephone Encounter (Signed)
-----   Message from Ladell Pier, MD sent at 07/07/2021  3:08 PM EDT ----- Please call patient, PT/INR is high, if she taking any new medications?,  Antibiotics?,  Hold Coumadin today and tomorrow, repeat PT/INR on 07/09/2021

## 2021-07-07 NOTE — Telephone Encounter (Signed)
TC to Pt per Dr Benay Spice to inform Pt of elevated PT/INR result (4.1)asked Pt if she was taking any new medications or antibiotics. Pt stated she was not taking any other medications. Informed Pt to hold coumadin today and tomorrow and to come and we will repeat labs on Friday. Pt was hesitant because she has to work on Friday but stated she could come in for an appointment at 3pm Pt scheduled to come in for lab appointment at 3. No further problems or concerns noted.

## 2021-07-09 ENCOUNTER — Telehealth: Payer: Self-pay

## 2021-07-09 ENCOUNTER — Other Ambulatory Visit: Payer: Self-pay

## 2021-07-09 ENCOUNTER — Inpatient Hospital Stay: Payer: Managed Care, Other (non HMO)

## 2021-07-09 DIAGNOSIS — I82402 Acute embolism and thrombosis of unspecified deep veins of left lower extremity: Secondary | ICD-10-CM

## 2021-07-09 LAB — PROTIME-INR
INR: 3.3 — ABNORMAL HIGH (ref 0.8–1.2)
Prothrombin Time: 33.6 seconds — ABNORMAL HIGH (ref 11.4–15.2)

## 2021-07-09 NOTE — Telephone Encounter (Signed)
TC to Pt Per Dr Benay Spice relayed message to continue to hold coumadin dose tonight 07/09/21 and to resume taking coumadin tomorrow 08/10/21 and labs on Wednesday. Pt verbalized understanding. No further problems or concerns noted.

## 2021-07-09 NOTE — Telephone Encounter (Signed)
-----   Message from Ladell Pier, MD sent at 07/09/2021  3:06 PM EDT ----- Please call patient, PT/INR is lower, hold Coumadin today, resume Coumadin at usual dose beginning 07/10/2021, repeat PT/INR on 07/14/2021 or 07/15/2021

## 2021-07-12 ENCOUNTER — Other Ambulatory Visit: Payer: Self-pay | Admitting: Oncology

## 2021-07-13 NOTE — Telephone Encounter (Signed)
Hold till INR results 07/14/21

## 2021-07-14 ENCOUNTER — Inpatient Hospital Stay: Payer: Managed Care, Other (non HMO) | Attending: Oncology

## 2021-07-14 ENCOUNTER — Other Ambulatory Visit: Payer: Self-pay

## 2021-07-14 DIAGNOSIS — I82402 Acute embolism and thrombosis of unspecified deep veins of left lower extremity: Secondary | ICD-10-CM | POA: Insufficient documentation

## 2021-07-14 LAB — PROTIME-INR
INR: 3 — ABNORMAL HIGH (ref 0.8–1.2)
Prothrombin Time: 31.2 seconds — ABNORMAL HIGH (ref 11.4–15.2)

## 2021-08-02 ENCOUNTER — Other Ambulatory Visit: Payer: Managed Care, Other (non HMO)

## 2021-08-04 ENCOUNTER — Other Ambulatory Visit: Payer: Self-pay

## 2021-08-04 ENCOUNTER — Inpatient Hospital Stay: Payer: Managed Care, Other (non HMO)

## 2021-08-04 DIAGNOSIS — I82402 Acute embolism and thrombosis of unspecified deep veins of left lower extremity: Secondary | ICD-10-CM

## 2021-08-04 LAB — PROTIME-INR
INR: 2.6 — ABNORMAL HIGH (ref 0.8–1.2)
Prothrombin Time: 27.9 seconds — ABNORMAL HIGH (ref 11.4–15.2)

## 2021-08-05 ENCOUNTER — Telehealth: Payer: Self-pay

## 2021-08-05 NOTE — Telephone Encounter (Signed)
-----   Message from Ladell Pier, MD sent at 08/04/2021  4:10 PM EDT ----- Please call patient, PT/INR is therapeutic, continue Coumadin at the same dose, check PT/INR in 1 month

## 2021-08-05 NOTE — Telephone Encounter (Signed)
TC to Pt to give results of PT/INR v/m message left labs being therapeutic and to continue same dose. Lab in 1 month. Pt informed to return call if she has any problems or concerns.

## 2021-08-07 ENCOUNTER — Emergency Department (HOSPITAL_BASED_OUTPATIENT_CLINIC_OR_DEPARTMENT_OTHER): Payer: Managed Care, Other (non HMO) | Admitting: Radiology

## 2021-08-07 ENCOUNTER — Encounter (HOSPITAL_BASED_OUTPATIENT_CLINIC_OR_DEPARTMENT_OTHER): Payer: Self-pay | Admitting: Emergency Medicine

## 2021-08-07 ENCOUNTER — Emergency Department (HOSPITAL_BASED_OUTPATIENT_CLINIC_OR_DEPARTMENT_OTHER)
Admission: EM | Admit: 2021-08-07 | Discharge: 2021-08-07 | Disposition: A | Discharge status: Home / Self Care | Payer: Managed Care, Other (non HMO) | Attending: Emergency Medicine | Admitting: Emergency Medicine

## 2021-08-07 DIAGNOSIS — Z87891 Personal history of nicotine dependence: Secondary | ICD-10-CM | POA: Insufficient documentation

## 2021-08-07 DIAGNOSIS — X58XXXA Exposure to other specified factors, initial encounter: Secondary | ICD-10-CM | POA: Insufficient documentation

## 2021-08-07 DIAGNOSIS — M25571 Pain in right ankle and joints of right foot: Secondary | ICD-10-CM | POA: Insufficient documentation

## 2021-08-07 DIAGNOSIS — S99911A Unspecified injury of right ankle, initial encounter: Secondary | ICD-10-CM | POA: Diagnosis present

## 2021-08-07 DIAGNOSIS — Z7901 Long term (current) use of anticoagulants: Secondary | ICD-10-CM | POA: Diagnosis not present

## 2021-08-07 DIAGNOSIS — S96911A Strain of unspecified muscle and tendon at ankle and foot level, right foot, initial encounter: Secondary | ICD-10-CM | POA: Insufficient documentation

## 2021-08-07 MED ORDER — ACETAMINOPHEN 500 MG PO TABS
1000.0000 mg | ORAL_TABLET | Freq: Once | ORAL | Status: AC
  Administered 2021-08-07: 1000 mg via ORAL
  Filled 2021-08-07: qty 2

## 2021-08-07 MED ORDER — OXYCODONE HCL 5 MG PO TABS
5.0000 mg | ORAL_TABLET | Freq: Once | ORAL | Status: AC
  Administered 2021-08-07: 5 mg via ORAL
  Filled 2021-08-07: qty 1

## 2021-08-07 NOTE — ED Triage Notes (Signed)
Pt presentw with right foot pain. Says started this am, woke with her foot hurting.couldn;t bear weight. Pt points to a slightly red area .

## 2021-08-07 NOTE — ED Provider Notes (Signed)
[facility] EMERGENCY DEPT Provider Note   CSN: [id number] Arrival date & time: 08/07/21  [id number]     History Chief Complaint  Patient presents with   Foot Pain    [name] [name] is a 60 y.o. female.  60 yo F with a chief complaints of right ankle pain.  Started last night and then worsened today.  Felt like she was having trouble bearing weight.  No obvious injury.  No fevers or chills.  She feels like the coloration of her foot is slightly different than it was previously.  The history is provided by the patient.  Foot Pain This is a new problem. The current episode started 2 days ago. The problem occurs constantly. The problem has not changed since onset.Pertinent negatives include no chest pain, no headaches and no shortness of breath. The symptoms are aggravated by walking. She has tried nothing for the symptoms. The treatment provided no relief.      Past Medical History:  Diagnosis Date   DVT (deep venous thrombosis) (HCC)    left leg    Patient Active Problem List   Diagnosis Date Noted   Paronychia 10/31/2014   Iron deficiency anemia 10/24/2011   Phlebitis and thrombophlebitis 10/24/2011    Past Surgical History:  Procedure Laterality Date   SPINE SURGERY       OB History   No obstetric history on file.     Family History  Problem Relation Age of Onset   Lung cancer Mother    Cancer Father     Social History   Tobacco Use   Smoking status: Former    Types: Cigarettes   Smokeless tobacco: Never  Vaping Use   Vaping Use: Never used  Substance Use Topics   Alcohol use: Never    Alcohol/week: 0.0 standard drinks   Drug use: Yes    Home Medications Prior to Admission medications   Medication Sig Start Date End Date Taking? Authorizing Provider  warfarin (COUMADIN) 5 MG tablet TAKE 1 TABLET BY MOUTH ONCE DAILY AT  6  PM Patient taking differently: TAKE 1 TABLET BY MOUTH ONCE DAILY AT  Monday and Thursday 7.'5mg'$  07/15/21  Yes  Ladell Pier, MD  acetaminophen (TYLENOL) 325 MG tablet Take 650 mg by mouth every 6 (six) hours as needed.      [provider]  diphenhydramine-acetaminophen (TYLENOL PM) 25-500 MG TABS tablet Take 1 tablet by mouth at bedtime as needed (sleep).    [provider]    Allergies    Patient has no known allergies.  Review of Systems   Review of Systems  Constitutional:  Negative for chills and fever.  HENT:  Negative for congestion and rhinorrhea.   Eyes:  Negative for redness and visual disturbance.  Respiratory:  Negative for shortness of breath and wheezing.   Cardiovascular:  Negative for chest pain and palpitations.  Gastrointestinal:  Negative for nausea and vomiting.  Genitourinary:  Negative for dysuria and urgency.  Musculoskeletal:  Positive for arthralgias and myalgias.  Skin:  Negative for pallor and wound.  Neurological:  Negative for dizziness and headaches.   Physical Exam Updated Vital Signs BP (!) 141/73   Pulse 68   Temp 98 F (36.7 C) (Oral)   Resp 16   SpO2 100%   Physical Exam Vitals and nursing note reviewed.  Constitutional:      General: She is not in acute distress.    Appearance: She is well-developed. She is not diaphoretic.  HENT:     Head: Normocephalic and atraumatic.  Eyes:     Pupils: Pupils are equal, round, and reactive to light.  Cardiovascular:     Rate and Rhythm: Normal rate and regular rhythm.     Heart sounds: No murmur heard.   No friction rub. No gallop.  Pulmonary:     Effort: Pulmonary effort is normal.     Breath sounds: No wheezing or rales.  Abdominal:     General: There is no distension.     Palpations: Abdomen is soft.     Tenderness: There is no abdominal tenderness.  Musculoskeletal:        General: Swelling and tenderness present.     Cervical back: Normal range of motion and neck supple.     Comments: Focal swelling in the area of the anterior talofibular ligament.  No significant tenderness  to the navicular or the base of the fifth metatarsal.  No obvious medial or lateral malleolus tenderness.  Pulse motor and sensation intact distally.  Skin:    General: Skin is warm and dry.  Neurological:     Mental Status: She is alert and oriented to person, place, and time.  Psychiatric:        Behavior: Behavior normal.    ED Results / Procedures / Treatments   Labs (all labs ordered are listed, but only abnormal results are displayed) Labs Reviewed - No data to display  EKG None  Radiology DG Ankle Complete Right  Result Date: 08/07/2021 CLINICAL DATA:  60 year old female with right ankle pain and foot pain this morning. Unable to weightbear. EXAM: RIGHT ANKLE - COMPLETE 3+ VIEW COMPARISON:  None. FINDINGS: Bone mineralization is within normal limits. Preserved mortise joint alignment. Talar dome intact. No joint effusion. Calcaneus intact with prominent degenerative spurring. No acute osseous abnormality identified. IMPRESSION: No acute osseous abnormality identified about the right ankle. Electronically Signed   By: Genevie Ann M.D.   On: 08/07/2021 10:18    Procedures Procedures   Medications Ordered in ED Medications  acetaminophen (TYLENOL) tablet 1,000 mg (1,000 mg Oral Given 08/07/21 0954)  oxyCODONE (Oxy IR/ROXICODONE) immediate release tablet 5 mg (5 mg Oral Given 08/07/21 [id number])    ED Course  I have reviewed the triage vital signs and the nursing notes.  Pertinent labs & imaging results that were available during my care of the patient were reviewed by me and considered in my medical decision making (see chart for details).    MDM Rules/Calculators/A&P                           60 yo F with a chief complaints of foot pain.  Going on since last night.  On exam patient with pain along the anterior talofibular ligament.  Likely sprain.  Will obtain a plain film.  Plain film viewed by me without fracture.  Will place in a splint.  Crutches.  PCP follow-up.  10:35 AM:   I have discussed the diagnosis/risks/treatment options with the patient and believe the pt to be eligible for discharge home to follow-up with PCP. We also discussed returning to the ED immediately if new or worsening sx occur. We discussed the sx which are most concerning (e.g., sudden worsening pain, fever, inability to tolerate by mouth) that necessitate immediate return. Medications administered to the patient during their visit and any new prescriptions provided to the patient are listed below.  Medications given during this visit  Medications  acetaminophen (TYLENOL) tablet 1,000 mg (1,000 mg Oral Given 08/07/21 0954)  oxyCODONE (Oxy IR/ROXICODONE) immediate release tablet 5 mg (5 mg Oral Given 08/07/21 0954)     The patient appears reasonably screen and/or stabilized for discharge and I doubt any other medical condition or other [facility] requiring further screening, evaluation, or treatment in the ED at this time prior to discharge.   Final Clinical Impression(s) / ED Diagnoses Final diagnoses:  Right ankle strain, initial encounter    Rx / DC Orders ED Discharge Orders     None        Deno Etienne, DO 08/07/21 1036

## 2021-08-07 NOTE — ED Notes (Signed)
Pt declined crutches. Dc instructions reviewed with patient and patient voiced understanding.

## 2021-08-07 NOTE — Discharge Instructions (Addendum)
Most likely you have sprained your ankle.  Please keep an eye on this if you have rapid spreading redness or if you develop a fever then please represent to be evaluated.  Tylenol and ibuprofen should be fine for pain.  Take 4 over the counter ibuprofen tablets 3 times a day or 2 over-the-counter naproxen tablets twice a day for pain. Also take tylenol '1000mg'$ (2 extra strength) four times a day.

## 2021-09-07 ENCOUNTER — Other Ambulatory Visit: Payer: Managed Care, Other (non HMO)

## 2021-09-07 ENCOUNTER — Ambulatory Visit: Payer: Managed Care, Other (non HMO) | Admitting: Oncology

## 2021-09-08 ENCOUNTER — Inpatient Hospital Stay (HOSPITAL_BASED_OUTPATIENT_CLINIC_OR_DEPARTMENT_OTHER): Payer: Managed Care, Other (non HMO) | Admitting: Oncology

## 2021-09-08 ENCOUNTER — Other Ambulatory Visit: Payer: Self-pay

## 2021-09-08 ENCOUNTER — Inpatient Hospital Stay: Payer: Managed Care, Other (non HMO) | Attending: Oncology

## 2021-09-08 VITALS — BP 155/78 | HR 73 | Temp 98.2°F | Resp 18 | Ht 67.0 in | Wt 234.0 lb

## 2021-09-08 DIAGNOSIS — R229 Localized swelling, mass and lump, unspecified: Secondary | ICD-10-CM | POA: Diagnosis not present

## 2021-09-08 DIAGNOSIS — N920 Excessive and frequent menstruation with regular cycle: Secondary | ICD-10-CM | POA: Insufficient documentation

## 2021-09-08 DIAGNOSIS — D6862 Lupus anticoagulant syndrome: Secondary | ICD-10-CM | POA: Diagnosis not present

## 2021-09-08 DIAGNOSIS — G8929 Other chronic pain: Secondary | ICD-10-CM | POA: Insufficient documentation

## 2021-09-08 DIAGNOSIS — I82402 Acute embolism and thrombosis of unspecified deep veins of left lower extremity: Secondary | ICD-10-CM | POA: Diagnosis not present

## 2021-09-08 DIAGNOSIS — Z7901 Long term (current) use of anticoagulants: Secondary | ICD-10-CM | POA: Diagnosis not present

## 2021-09-08 DIAGNOSIS — D500 Iron deficiency anemia secondary to blood loss (chronic): Secondary | ICD-10-CM | POA: Diagnosis not present

## 2021-09-08 DIAGNOSIS — Z86718 Personal history of other venous thrombosis and embolism: Secondary | ICD-10-CM | POA: Diagnosis not present

## 2021-09-08 LAB — PROTIME-INR
INR: 3.3 — ABNORMAL HIGH (ref 0.8–1.2)
Prothrombin Time: 33.3 seconds — ABNORMAL HIGH (ref 11.4–15.2)

## 2021-09-08 NOTE — Progress Notes (Signed)
  [name] OFFICE PROGRESS NOTE   Diagnosis: Chronic anticoagulation  INTERVAL HISTORY:   [name] returns as scheduled.  She feels well.  No bleeding or symptom of thrombosis.  She was seen in the emergency room on 08/07/2021 with pain in the right ankle.  No known trauma.  She was noted to have focal swelling at the anterior talofibular ligament.  A plain x-ray of the right ankle revealed no acute abnormality.  She reports resolution of the pain and swelling.  Objective:  Vital signs in last 24 hours:  Blood pressure (!) 155/78, pulse 73, temperature 98.2 F (36.8 C), temperature source Oral, resp. rate 18, height 5\' 7"  (1.702 m), weight 234 lb (106.1 kg), SpO2 99 %.   Resp: Lungs clear bilaterally Cardio: Regular rate and rhythm GI: No hepatosplenomegaly Vascular: No leg edema, Skeletal: Examination of the right foot is unremarkable   Lab Results:   Lab Results  Component Value Date   INR 3.3 (H) 09/08/2021   LABPROT 33.3 (H) 09/08/2021     Medications: I have reviewed the patient's current medications.   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 Repeat lupus and anticardiolipin antibody testing was negative until there was a positive lupus anticoagulant panel on 05/27/2008. There was a positive lupus anticoagulant and hexagonal phase study on 10/27/2009. She would like to continue Coumadin anticoagulation.   History of Anemia secondary to menorrhagia and iron deficiency. She is now amenorrheic. Chronic bilateral leg pain.     Disposition: [name] appears stable.  She will continue Coumadin anticoagulation.  We discussed discontinuing Coumadin and repeating testing for antiphospholipid syndrome.  She would like to continue Coumadin anticoagulation.  The PT/INR has been at the upper end of  the therapeutic range for the past several months.  She will decrease the Coumadin dose to 5 mg daily.  She will return for a PT/INR check in 3 weeks.  She has a history of iron deficiency anemia.  We will check a CBC when she is here in 3 weeks.  Betsy Coder, MD  09/08/2021  10:10 AM

## 2021-09-29 ENCOUNTER — Other Ambulatory Visit: Payer: Self-pay

## 2021-09-29 ENCOUNTER — Inpatient Hospital Stay: Payer: Managed Care, Other (non HMO) | Attending: Oncology

## 2021-09-29 DIAGNOSIS — I82402 Acute embolism and thrombosis of unspecified deep veins of left lower extremity: Secondary | ICD-10-CM

## 2021-09-29 LAB — PROTIME-INR
INR: 1.9 — ABNORMAL HIGH (ref 0.8–1.2)
Prothrombin Time: 21.4 seconds — ABNORMAL HIGH (ref 11.4–15.2)

## 2021-09-29 LAB — FERRITIN: Ferritin: 36 ng/mL (ref 11–307)

## 2021-09-29 LAB — CBC WITH DIFFERENTIAL (CANCER CENTER ONLY)
Abs Immature Granulocytes: 0.04 10*3/uL (ref 0.00–0.07)
Basophils Absolute: 0 10*3/uL (ref 0.0–0.1)
Basophils Relative: 1 %
Eosinophils Absolute: 0.3 10*3/uL (ref 0.0–0.5)
Eosinophils Relative: 6 %
HCT: 38.1 % (ref 36.0–46.0)
Hemoglobin: 12.4 g/dL (ref 12.0–15.0)
Immature Granulocytes: 1 %
Lymphocytes Relative: 30 %
Lymphs Abs: 1.8 10*3/uL (ref 0.7–4.0)
MCH: 29.1 pg (ref 26.0–34.0)
MCHC: 32.5 g/dL (ref 30.0–36.0)
MCV: 89.4 fL (ref 80.0–100.0)
Monocytes Absolute: 0.3 10*3/uL (ref 0.1–1.0)
Monocytes Relative: 6 %
Neutro Abs: 3.4 10*3/uL (ref 1.7–7.7)
Neutrophils Relative %: 56 %
Platelet Count: 319 10*3/uL (ref 150–400)
RBC: 4.26 MIL/uL (ref 3.87–5.11)
RDW: 13 % (ref 11.5–15.5)
WBC Count: 6 10*3/uL (ref 4.0–10.5)
nRBC: 0 % (ref 0.0–0.2)

## 2021-09-30 ENCOUNTER — Other Ambulatory Visit: Payer: Self-pay | Admitting: Oncology

## 2021-10-01 ENCOUNTER — Telehealth: Payer: Self-pay

## 2021-10-01 NOTE — Telephone Encounter (Signed)
Called no answer message left to continue coumadin at same dose and f/u x 1 month encouraged to call for any questions changes or concerns

## 2021-10-01 NOTE — Telephone Encounter (Signed)
-----   Message from Ladell Pier, MD sent at 09/29/2021  9:07 PM EDT ----- Please call patient, same Coumadin, repeat PT/INR in 1 month

## 2021-10-27 ENCOUNTER — Inpatient Hospital Stay: Payer: Managed Care, Other (non HMO) | Attending: Oncology

## 2021-10-27 ENCOUNTER — Other Ambulatory Visit: Payer: Self-pay

## 2021-10-27 DIAGNOSIS — Z862 Personal history of diseases of the blood and blood-forming organs and certain disorders involving the immune mechanism: Secondary | ICD-10-CM | POA: Insufficient documentation

## 2021-10-27 DIAGNOSIS — Z86718 Personal history of other venous thrombosis and embolism: Secondary | ICD-10-CM | POA: Insufficient documentation

## 2021-10-27 DIAGNOSIS — I82402 Acute embolism and thrombosis of unspecified deep veins of left lower extremity: Secondary | ICD-10-CM | POA: Insufficient documentation

## 2021-10-27 LAB — CBC WITH DIFFERENTIAL (CANCER CENTER ONLY)
Abs Immature Granulocytes: 0.02 10*3/uL (ref 0.00–0.07)
Basophils Absolute: 0.1 10*3/uL (ref 0.0–0.1)
Basophils Relative: 1 %
Eosinophils Absolute: 0.3 10*3/uL (ref 0.0–0.5)
Eosinophils Relative: 5 %
HCT: 39.6 % (ref 36.0–46.0)
Hemoglobin: 12.8 g/dL (ref 12.0–15.0)
Immature Granulocytes: 0 %
Lymphocytes Relative: 33 %
Lymphs Abs: 1.9 10*3/uL (ref 0.7–4.0)
MCH: 29 pg (ref 26.0–34.0)
MCHC: 32.3 g/dL (ref 30.0–36.0)
MCV: 89.8 fL (ref 80.0–100.0)
Monocytes Absolute: 0.5 10*3/uL (ref 0.1–1.0)
Monocytes Relative: 9 %
Neutro Abs: 2.9 10*3/uL (ref 1.7–7.7)
Neutrophils Relative %: 52 %
Platelet Count: 322 10*3/uL (ref 150–400)
RBC: 4.41 MIL/uL (ref 3.87–5.11)
RDW: 12.9 % (ref 11.5–15.5)
WBC Count: 5.6 10*3/uL (ref 4.0–10.5)
nRBC: 0 % (ref 0.0–0.2)

## 2021-10-27 LAB — PROTIME-INR
INR: 1.8 — ABNORMAL HIGH (ref 0.8–1.2)
Prothrombin Time: 21.3 seconds — ABNORMAL HIGH (ref 11.4–15.2)

## 2021-10-29 ENCOUNTER — Telehealth: Payer: Self-pay

## 2021-10-29 NOTE — Telephone Encounter (Signed)
Pt verbalized understanding.

## 2021-10-29 NOTE — Telephone Encounter (Signed)
-----   Message from Ladell Pier, MD sent at 10/29/2021  4:24 PM EST ----- Please call patient, same Coumadin, repeat PT/INR in 1 month

## 2021-12-01 ENCOUNTER — Inpatient Hospital Stay: Payer: Managed Care, Other (non HMO) | Attending: Oncology

## 2021-12-01 ENCOUNTER — Telehealth: Payer: Self-pay

## 2021-12-01 ENCOUNTER — Other Ambulatory Visit: Payer: Self-pay

## 2021-12-01 DIAGNOSIS — I82402 Acute embolism and thrombosis of unspecified deep veins of left lower extremity: Secondary | ICD-10-CM | POA: Diagnosis not present

## 2021-12-01 LAB — PROTIME-INR
INR: 2.8 — ABNORMAL HIGH (ref 0.8–1.2)
Prothrombin Time: 29.7 seconds — ABNORMAL HIGH (ref 11.4–15.2)

## 2021-12-01 NOTE — Telephone Encounter (Signed)
V/M message left for Pt to continue same dose of coumadin. Informed if any questions or concerns to return call to the office.

## 2021-12-01 NOTE — Telephone Encounter (Signed)
-----   Message from Ladell Pier, MD sent at 12/01/2021  2:25 PM EST ----- Please call patient, same Coumadin dose, check PT/INR in 1 month

## 2021-12-29 ENCOUNTER — Inpatient Hospital Stay: Payer: Managed Care, Other (non HMO) | Attending: Oncology

## 2021-12-29 ENCOUNTER — Other Ambulatory Visit: Payer: Self-pay

## 2021-12-29 DIAGNOSIS — Z7901 Long term (current) use of anticoagulants: Secondary | ICD-10-CM | POA: Insufficient documentation

## 2021-12-29 DIAGNOSIS — Z79899 Other long term (current) drug therapy: Secondary | ICD-10-CM | POA: Diagnosis not present

## 2021-12-29 DIAGNOSIS — I82402 Acute embolism and thrombosis of unspecified deep veins of left lower extremity: Secondary | ICD-10-CM | POA: Diagnosis not present

## 2021-12-29 LAB — PROTIME-INR
INR: 1.8 — ABNORMAL HIGH (ref 0.8–1.2)
Prothrombin Time: 20.7 seconds — ABNORMAL HIGH (ref 11.4–15.2)

## 2021-12-30 ENCOUNTER — Other Ambulatory Visit: Payer: Self-pay | Admitting: Oncology

## 2021-12-30 ENCOUNTER — Telehealth: Payer: Self-pay

## 2021-12-30 NOTE — Telephone Encounter (Signed)
V/M left for Pt with instructions to continue same dose of coumadin and to repeat PT/INR in 1 month. Message left to return call if any problems or concerns.

## 2021-12-30 NOTE — Telephone Encounter (Signed)
-----   Message from Ladell Pier, MD sent at 12/29/2021  3:10 PM EST ----- Please call patient, same dose Coumadin, repeat PT/INR in 1 month

## 2022-02-02 ENCOUNTER — Other Ambulatory Visit: Payer: Self-pay

## 2022-02-02 ENCOUNTER — Inpatient Hospital Stay: Payer: Managed Care, Other (non HMO) | Attending: Oncology

## 2022-02-02 ENCOUNTER — Telehealth: Payer: Self-pay

## 2022-02-02 DIAGNOSIS — I82402 Acute embolism and thrombosis of unspecified deep veins of left lower extremity: Secondary | ICD-10-CM

## 2022-02-02 LAB — PROTIME-INR
INR: 2.4 — ABNORMAL HIGH (ref 0.8–1.2)
Prothrombin Time: 26.5 seconds — ABNORMAL HIGH (ref 11.4–15.2)

## 2022-02-02 NOTE — Telephone Encounter (Signed)
-----   Message from Ladell Pier, MD sent at 02/02/2022  3:33 PM EST ----- Please call patient, same dose coumadin, PT INR  1 month

## 2022-02-02 NOTE — Telephone Encounter (Signed)
Pt verbalized understanding.

## 2022-02-25 ENCOUNTER — Other Ambulatory Visit: Payer: Self-pay

## 2022-02-25 DIAGNOSIS — I82402 Acute embolism and thrombosis of unspecified deep veins of left lower extremity: Secondary | ICD-10-CM

## 2022-02-26 IMAGING — CR DG CHEST 2V
2 series · 2 of 2 positions shown · non-contrast
Comparison: Radiograph 01/05/2005

CLINICAL DATA: Leg swelling. Bilateral leg swelling for 6 days.

EXAM:
CHEST - 2 VIEW

[w chest lat]
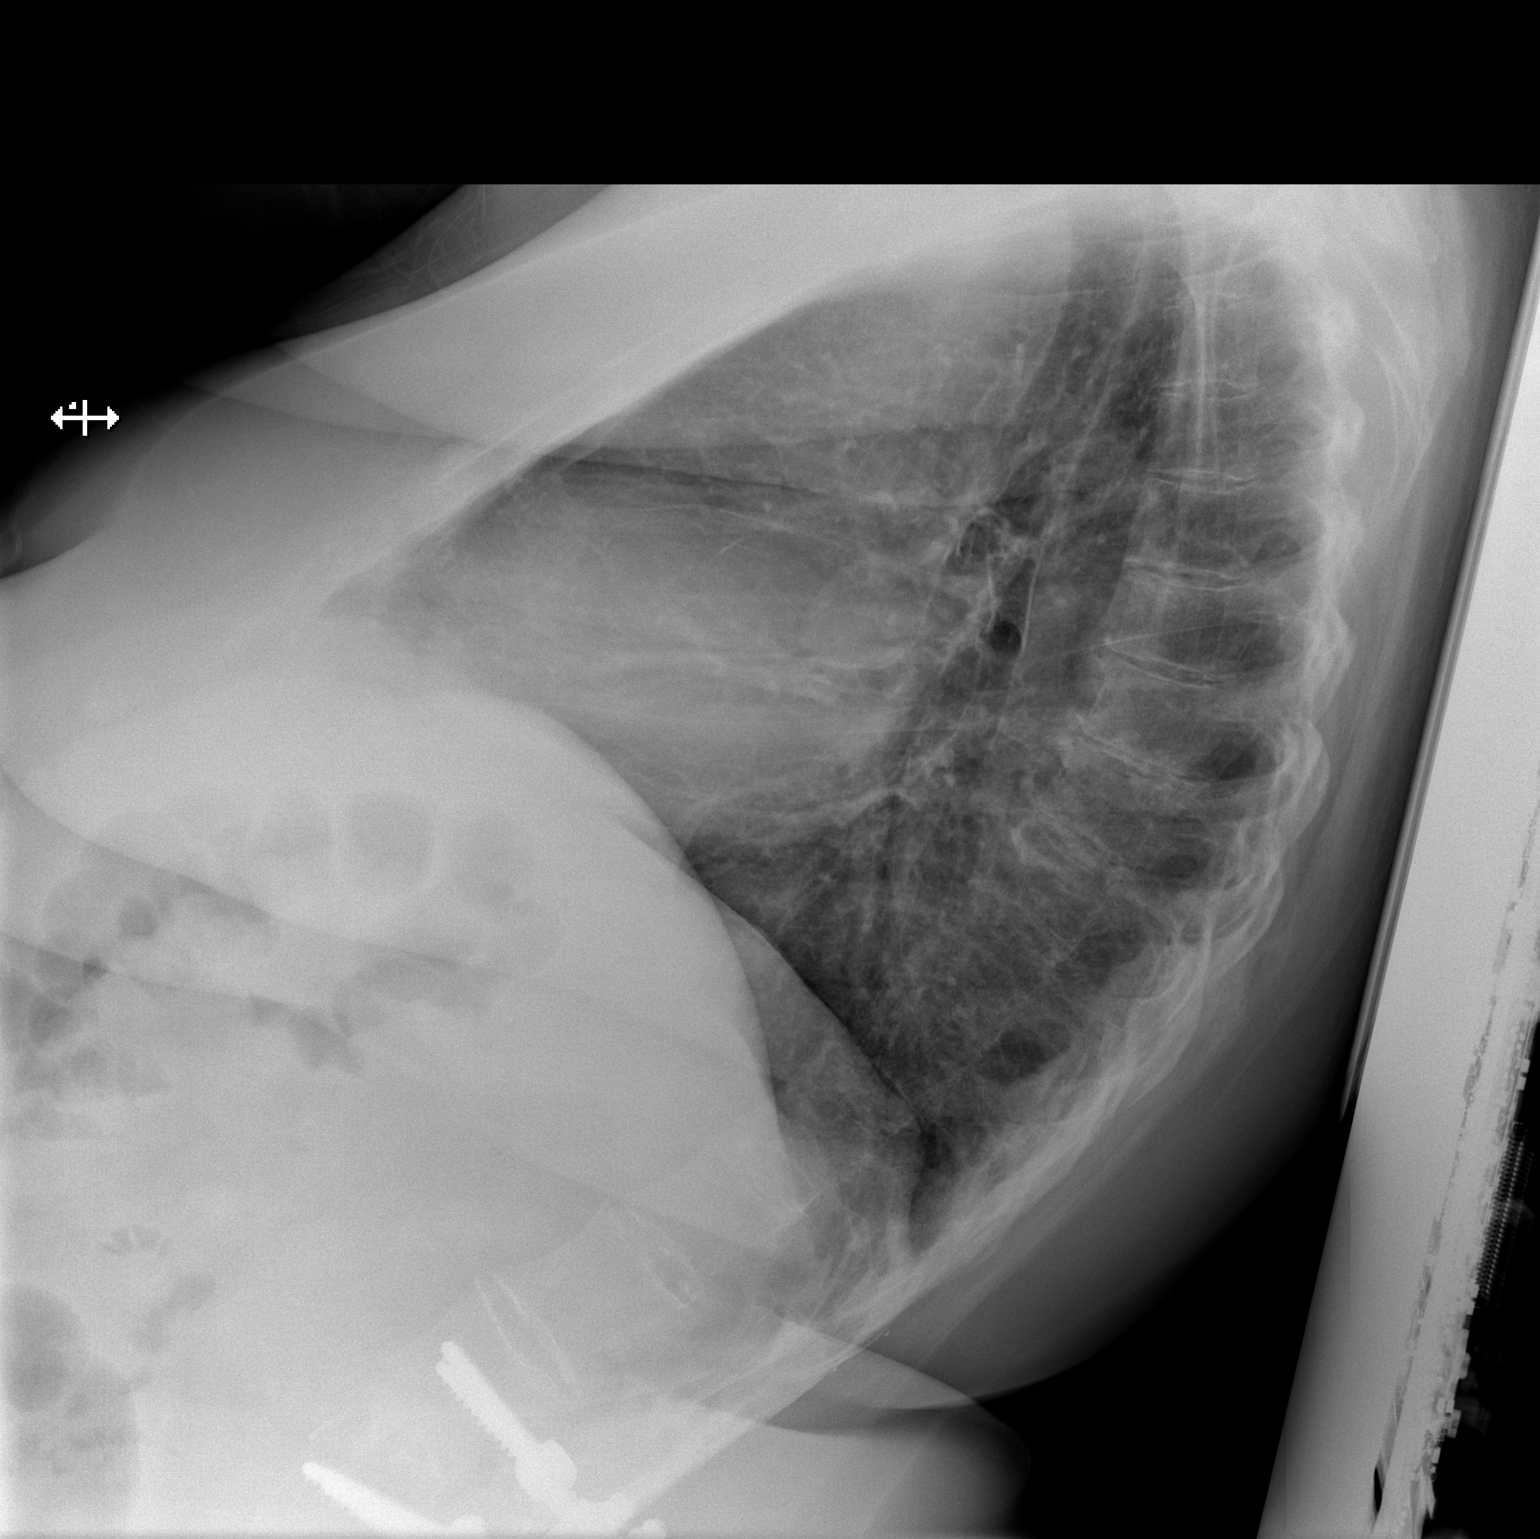

[x chest ap]
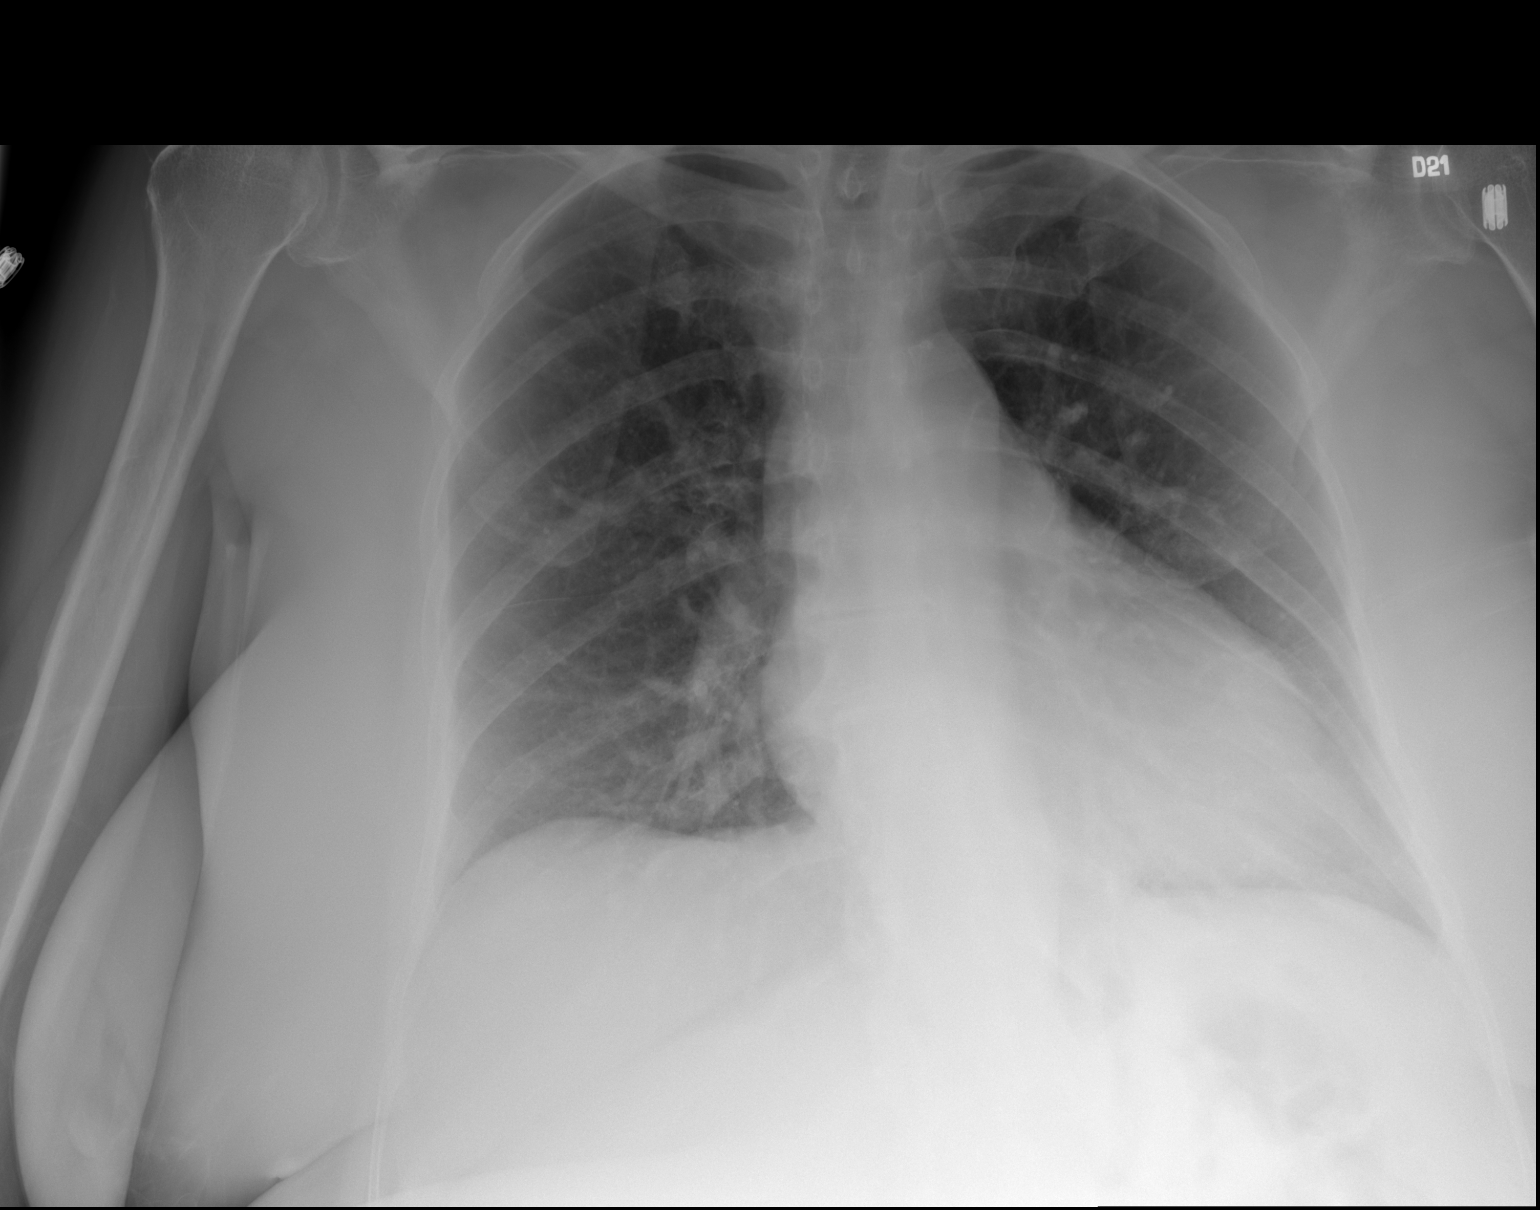

[2 of 2 positions shown; findings below may reference images not displayed]

FINDINGS: Borderline cardiomegaly. Normal mediastinal contours. Mild vascular
congestion without pulmonary edema. No focal airspace disease. No
pleural fluid. No pneumothorax. Scoliosis and surgical hardware in
the spine. No acute osseous abnormalities are seen.
IMPRESSION: Borderline cardiomegaly with mild vascular congestion.

## 2022-03-02 ENCOUNTER — Inpatient Hospital Stay: Payer: Managed Care, Other (non HMO) | Attending: Oncology

## 2022-03-02 ENCOUNTER — Other Ambulatory Visit: Payer: Self-pay

## 2022-03-02 ENCOUNTER — Telehealth: Payer: Self-pay

## 2022-03-02 DIAGNOSIS — I82402 Acute embolism and thrombosis of unspecified deep veins of left lower extremity: Secondary | ICD-10-CM | POA: Diagnosis not present

## 2022-03-02 LAB — PROTIME-INR
INR: 2 — ABNORMAL HIGH (ref 0.8–1.2)
Prothrombin Time: 22.2 seconds — ABNORMAL HIGH (ref 11.4–15.2)

## 2022-03-02 NOTE — Telephone Encounter (Signed)
Patient gave verbal understanding and denied any further questions. ?

## 2022-03-02 NOTE — Telephone Encounter (Signed)
-----   Message from Ladell Pier, MD sent at 03/02/2022  1:22 PM EDT ----- ?Please call patient, continue Coumadin at the same dose, check PT/INR in 1 month ? ?

## 2022-03-24 ENCOUNTER — Other Ambulatory Visit: Payer: Self-pay

## 2022-03-24 DIAGNOSIS — I82402 Acute embolism and thrombosis of unspecified deep veins of left lower extremity: Secondary | ICD-10-CM

## 2022-03-30 ENCOUNTER — Telehealth: Payer: Self-pay

## 2022-03-30 ENCOUNTER — Inpatient Hospital Stay: Payer: Managed Care, Other (non HMO) | Attending: Oncology

## 2022-03-30 DIAGNOSIS — I82402 Acute embolism and thrombosis of unspecified deep veins of left lower extremity: Secondary | ICD-10-CM | POA: Diagnosis present

## 2022-03-30 LAB — PROTIME-INR
INR: 1.8 — ABNORMAL HIGH (ref 0.8–1.2)
Prothrombin Time: 20.5 seconds — ABNORMAL HIGH (ref 11.4–15.2)

## 2022-03-30 NOTE — Telephone Encounter (Signed)
-----   Message from Ladell Pier, MD sent at 03/30/2022 12:15 PM EDT ----- ?Please call patient, same Coumadin, check PT/INR in 1 month ? ? ?

## 2022-03-30 NOTE — Telephone Encounter (Signed)
V/BM message to Pt relaying message. Informed if any problems or concerns to return call to office. ?

## 2022-03-31 ENCOUNTER — Other Ambulatory Visit: Payer: Self-pay | Admitting: Oncology

## 2022-04-06 ENCOUNTER — Telehealth: Payer: Self-pay | Admitting: *Deleted

## 2022-04-06 DIAGNOSIS — I82402 Acute embolism and thrombosis of unspecified deep veins of left lower extremity: Secondary | ICD-10-CM

## 2022-04-06 NOTE — Telephone Encounter (Signed)
Presented to clinic requesting MD complete form for dental office for #6 extractions that she wanted to do today. Informed her it is not safe for procedure today, since her warfarin has not been on hold.  ?Per Dr. Benay Spice: Hold warfarin x 4 days prior to procedure and check INR day of procedure (prior). Resume at usual dose pm of procedure. F/U INR as scheduled. This was noted on her form and faxed to [facility] at [phone]. ?She plans to hold warfarin beginning 4/27 and have procedure on 5/01. Lab scheduled for that am. ?

## 2022-04-11 ENCOUNTER — Telehealth: Payer: Self-pay | Admitting: *Deleted

## 2022-04-11 ENCOUNTER — Inpatient Hospital Stay: Payer: Managed Care, Other (non HMO) | Attending: Oncology

## 2022-04-11 DIAGNOSIS — I82402 Acute embolism and thrombosis of unspecified deep veins of left lower extremity: Secondary | ICD-10-CM | POA: Insufficient documentation

## 2022-04-11 LAB — PROTIME-INR
INR: 1 (ref 0.8–1.2)
Prothrombin Time: 13 seconds (ref 11.4–15.2)

## 2022-04-11 NOTE — Telephone Encounter (Signed)
Call from staff member at [facility] regarding INR today. INR 1.8--OK to proceed w/extractions if dentist is comfortable with this per Dr. Benay Spice. Patient has held her warfarin x 4 days.  ?

## 2022-04-29 ENCOUNTER — Other Ambulatory Visit: Payer: Self-pay

## 2022-04-29 DIAGNOSIS — I82402 Acute embolism and thrombosis of unspecified deep veins of left lower extremity: Secondary | ICD-10-CM

## 2022-05-04 ENCOUNTER — Telehealth: Payer: Self-pay

## 2022-05-04 ENCOUNTER — Inpatient Hospital Stay: Payer: Managed Care, Other (non HMO)

## 2022-05-04 DIAGNOSIS — I82402 Acute embolism and thrombosis of unspecified deep veins of left lower extremity: Secondary | ICD-10-CM | POA: Diagnosis not present

## 2022-05-04 LAB — PROTIME-INR
INR: 1.8 — ABNORMAL HIGH (ref 0.8–1.2)
Prothrombin Time: 21.1 seconds — ABNORMAL HIGH (ref 11.4–15.2)

## 2022-05-04 NOTE — Telephone Encounter (Signed)
Pt verbalized understanding.

## 2022-05-04 NOTE — Telephone Encounter (Signed)
-----   Message from Ladell Pier, MD sent at 05/04/2022  1:58 PM EDT ----- Please call patient, continue Coumadin at the same dose, repeat PT/INR in 1 month

## 2022-05-25 ENCOUNTER — Other Ambulatory Visit: Payer: Self-pay

## 2022-05-25 DIAGNOSIS — I82402 Acute embolism and thrombosis of unspecified deep veins of left lower extremity: Secondary | ICD-10-CM

## 2022-06-01 ENCOUNTER — Telehealth: Payer: Self-pay

## 2022-06-01 ENCOUNTER — Inpatient Hospital Stay: Payer: Managed Care, Other (non HMO) | Attending: Oncology

## 2022-06-01 DIAGNOSIS — I82402 Acute embolism and thrombosis of unspecified deep veins of left lower extremity: Secondary | ICD-10-CM | POA: Diagnosis present

## 2022-06-01 LAB — PROTIME-INR
INR: 2.1 — ABNORMAL HIGH (ref 0.8–1.2)
Prothrombin Time: 23.4 seconds — ABNORMAL HIGH (ref 11.4–15.2)

## 2022-06-01 NOTE — Telephone Encounter (Signed)
-----   Message from [name] Pier, MD sent at 06/01/2022  1:43 PM EDT ----- Please call patient, continue Coumadin at same dose, repeat PT/INR in 1 month

## 2022-06-01 NOTE — Telephone Encounter (Signed)
Left message on v/m. Pt informed to return call if any problems or questions.

## 2022-06-22 ENCOUNTER — Other Ambulatory Visit: Payer: Self-pay

## 2022-06-22 DIAGNOSIS — I82402 Acute embolism and thrombosis of unspecified deep veins of left lower extremity: Secondary | ICD-10-CM

## 2022-06-29 ENCOUNTER — Inpatient Hospital Stay: Payer: Managed Care, Other (non HMO) | Attending: Oncology

## 2022-06-29 ENCOUNTER — Telehealth: Payer: Self-pay

## 2022-06-29 DIAGNOSIS — Z86718 Personal history of other venous thrombosis and embolism: Secondary | ICD-10-CM | POA: Diagnosis present

## 2022-06-29 DIAGNOSIS — Z7901 Long term (current) use of anticoagulants: Secondary | ICD-10-CM | POA: Diagnosis not present

## 2022-06-29 DIAGNOSIS — I82402 Acute embolism and thrombosis of unspecified deep veins of left lower extremity: Secondary | ICD-10-CM

## 2022-06-29 LAB — PROTIME-INR
INR: 2.4 — ABNORMAL HIGH (ref 0.8–1.2)
Prothrombin Time: 25.7 seconds — ABNORMAL HIGH (ref 11.4–15.2)

## 2022-06-29 NOTE — Telephone Encounter (Signed)
Pt verbalized understanding.

## 2022-06-29 NOTE — Telephone Encounter (Signed)
-----   Message from Ladell Pier, MD sent at 06/29/2022  3:41 PM EDT ----- Please call patient, continue Coumadin at same dose, repeat PT/INR in 1 month

## 2022-07-01 ENCOUNTER — Other Ambulatory Visit: Payer: Self-pay | Admitting: Oncology

## 2022-07-27 ENCOUNTER — Inpatient Hospital Stay: Payer: Managed Care, Other (non HMO) | Attending: Oncology

## 2022-07-27 ENCOUNTER — Other Ambulatory Visit: Payer: Self-pay

## 2022-07-27 DIAGNOSIS — I82402 Acute embolism and thrombosis of unspecified deep veins of left lower extremity: Secondary | ICD-10-CM

## 2022-08-31 ENCOUNTER — Inpatient Hospital Stay: Payer: Managed Care, Other (non HMO) | Attending: Oncology | Admitting: Oncology

## 2022-08-31 ENCOUNTER — Inpatient Hospital Stay: Payer: Managed Care, Other (non HMO)

## 2022-08-31 ENCOUNTER — Encounter: Payer: Self-pay | Admitting: Oncology

## 2022-08-31 VITALS — BP 154/74 | HR 68 | Temp 98.1°F | Resp 20 | Ht 67.0 in | Wt 223.0 lb

## 2022-08-31 DIAGNOSIS — I82402 Acute embolism and thrombosis of unspecified deep veins of left lower extremity: Secondary | ICD-10-CM | POA: Diagnosis not present

## 2022-08-31 DIAGNOSIS — Z7901 Long term (current) use of anticoagulants: Secondary | ICD-10-CM | POA: Diagnosis not present

## 2022-08-31 DIAGNOSIS — G8929 Other chronic pain: Secondary | ICD-10-CM | POA: Diagnosis not present

## 2022-08-31 DIAGNOSIS — D500 Iron deficiency anemia secondary to blood loss (chronic): Secondary | ICD-10-CM | POA: Insufficient documentation

## 2022-08-31 DIAGNOSIS — M79604 Pain in right leg: Secondary | ICD-10-CM | POA: Diagnosis not present

## 2022-08-31 DIAGNOSIS — I82409 Acute embolism and thrombosis of unspecified deep veins of unspecified lower extremity: Secondary | ICD-10-CM | POA: Insufficient documentation

## 2022-08-31 DIAGNOSIS — Z79899 Other long term (current) drug therapy: Secondary | ICD-10-CM | POA: Diagnosis not present

## 2022-08-31 DIAGNOSIS — D6862 Lupus anticoagulant syndrome: Secondary | ICD-10-CM | POA: Diagnosis not present

## 2022-08-31 DIAGNOSIS — N920 Excessive and frequent menstruation with regular cycle: Secondary | ICD-10-CM | POA: Insufficient documentation

## 2022-08-31 DIAGNOSIS — M79605 Pain in left leg: Secondary | ICD-10-CM | POA: Diagnosis not present

## 2022-08-31 DIAGNOSIS — Z86718 Personal history of other venous thrombosis and embolism: Secondary | ICD-10-CM | POA: Insufficient documentation

## 2022-08-31 LAB — PROTIME-INR
INR: 2.6 — ABNORMAL HIGH (ref 0.8–1.2)
Prothrombin Time: 27.4 seconds — ABNORMAL HIGH (ref 11.4–15.2)

## 2022-08-31 NOTE — Progress Notes (Signed)
  [place] OFFICE PROGRESS NOTE   Diagnosis: Recurrent venous thrombosis, Coumadin anticoagulation  INTERVAL HISTORY:   [name] [name] continues Coumadin.  No bleeding or symptom of recurrent thrombosis.  No complaint.  She is taking Coumadin at a dose of 5 mg daily.  Objective:  Vital signs in last 24 hours:  Blood pressure (!) 154/74, pulse 68, temperature 98.1 F (36.7 C), temperature source Oral, resp. rate 20, height '5\' 7"'$  (1.702 m), weight 223 lb (101.2 kg), SpO2 100 %.    Resp: Lungs clear bilaterally Cardio: Regular rate and rhythm GI: No hepatosplenomegaly, nontender Vascular: No leg edema   Lab Results:  Lab Results  Component Value Date   WBC 5.6 10/27/2021   HGB 12.8 10/27/2021   HCT 39.6 10/27/2021   MCV 89.8 10/27/2021   PLT 322 10/27/2021   NEUTROABS 2.9 10/27/2021    CMP  Lab Results  Component Value Date   NA 134 (L) 03/19/2020   K 3.4 (L) 03/19/2020   CL 97 (L) 03/19/2020   CO2 25 03/19/2020   GLUCOSE 112 (H) 03/19/2020   BUN 18 03/19/2020   CREATININE 0.74 03/19/2020   CALCIUM 8.7 (L) 03/19/2020   PROT 8.1 01/07/2016   ALBUMIN 4.5 01/07/2016   AST 29 01/07/2016   ALT 33 01/07/2016   ALKPHOS 88 01/07/2016   BILITOT 0.5 01/07/2016   GFRNONAA >60 03/19/2020   GFRAA >60 03/19/2020   Lab Results  Component Value Date   INR 2.6 (H) 08/31/2022   LABPROT 27.4 (H) 08/31/2022    Medications: I have reviewed the patient's current medications.   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 Repeat lupus and anticardiolipin antibody testing was negative until there was a positive lupus anticoagulant panel on 05/27/2008. There was a positive lupus anticoagulant and hexagonal phase study on 10/27/2009. She would like to continue Coumadin anticoagulation.   History of Anemia  secondary to menorrhagia and iron deficiency. She is now amenorrheic. Chronic bilateral leg pain.         Disposition: [name] [name] has a history of recurrent lower extremity venous thrombosis.  She is maintained on indefinite Coumadin anticoagulation.  We again discussed the indication for repeat lupus anticoagulant testing and potentially discontinue anticoagulation.  She would like to continue Coumadin.  She will return for a PT/INR in 6 weeks.  She will be scheduled for an office visit in 1 year.  I recommended she follow-up with Dr. Olen Pel for internal medicine care and she remain up-to-date on cancer screenings.  Betsy Coder, MD  08/31/2022  1:23 PM

## 2022-10-03 ENCOUNTER — Other Ambulatory Visit: Payer: Self-pay | Admitting: Oncology

## 2022-10-12 ENCOUNTER — Inpatient Hospital Stay: Payer: Managed Care, Other (non HMO) | Attending: Oncology

## 2022-10-12 ENCOUNTER — Telehealth: Payer: Self-pay | Admitting: *Deleted

## 2022-10-12 DIAGNOSIS — Z86718 Personal history of other venous thrombosis and embolism: Secondary | ICD-10-CM | POA: Insufficient documentation

## 2022-10-12 DIAGNOSIS — I82402 Acute embolism and thrombosis of unspecified deep veins of left lower extremity: Secondary | ICD-10-CM | POA: Diagnosis present

## 2022-10-12 LAB — PROTIME-INR
INR: 2.5 — ABNORMAL HIGH (ref 0.8–1.2)
Prothrombin Time: 26.5 seconds — ABNORMAL HIGH (ref 11.4–15.2)

## 2022-10-12 NOTE — Telephone Encounter (Signed)
-----   Message from Ladell Pier, MD sent at 10/12/2022  2:14 PM EDT ----- Please call patient, same Coumadin dose, check PT/INR in 6 weeks

## 2022-10-12 NOTE — Telephone Encounter (Signed)
Called her w/INR 2.5 and to continue warfarin at 5 mg daily. Will recheck in 6 weeks (12/13 at 0830 scheduled).

## 2022-11-18 ENCOUNTER — Other Ambulatory Visit: Payer: Self-pay | Admitting: *Deleted

## 2022-11-18 DIAGNOSIS — I82402 Acute embolism and thrombosis of unspecified deep veins of left lower extremity: Secondary | ICD-10-CM

## 2022-11-23 ENCOUNTER — Inpatient Hospital Stay: Payer: Managed Care, Other (non HMO) | Attending: Oncology

## 2022-11-23 DIAGNOSIS — I82402 Acute embolism and thrombosis of unspecified deep veins of left lower extremity: Secondary | ICD-10-CM | POA: Insufficient documentation

## 2022-11-23 DIAGNOSIS — Z86718 Personal history of other venous thrombosis and embolism: Secondary | ICD-10-CM | POA: Insufficient documentation

## 2022-11-23 LAB — PROTIME-INR
INR: 1.7 — ABNORMAL HIGH (ref 0.8–1.2)
Prothrombin Time: 19.8 seconds — ABNORMAL HIGH (ref 11.4–15.2)

## 2022-11-24 ENCOUNTER — Other Ambulatory Visit: Payer: Self-pay

## 2022-11-24 ENCOUNTER — Telehealth: Payer: Self-pay | Admitting: *Deleted

## 2022-11-24 DIAGNOSIS — I82402 Acute embolism and thrombosis of unspecified deep veins of left lower extremity: Secondary | ICD-10-CM

## 2022-11-24 NOTE — Telephone Encounter (Signed)
Called patient and left VM to call back re: INR and warfarin dose.

## 2022-11-24 NOTE — Telephone Encounter (Signed)
-----   Message from Ladell Pier, MD sent at 11/23/2022  8:19 PM EST ----- Please call patient continue Coumadin, same dose, repeat PT/INR in 1 month

## 2022-11-25 NOTE — Telephone Encounter (Signed)
-----   Message from Ladell Pier, MD sent at 11/23/2022  8:19 PM EST ----- Please call patient continue Coumadin, same dose, repeat PT/INR in 1 month

## 2022-12-21 ENCOUNTER — Telehealth: Payer: Self-pay

## 2022-12-21 ENCOUNTER — Other Ambulatory Visit: Payer: Self-pay

## 2022-12-21 ENCOUNTER — Inpatient Hospital Stay: Payer: Managed Care, Other (non HMO) | Attending: Oncology

## 2022-12-21 DIAGNOSIS — I82402 Acute embolism and thrombosis of unspecified deep veins of left lower extremity: Secondary | ICD-10-CM | POA: Diagnosis present

## 2022-12-21 DIAGNOSIS — Z86718 Personal history of other venous thrombosis and embolism: Secondary | ICD-10-CM | POA: Insufficient documentation

## 2022-12-21 LAB — PROTIME-INR
INR: 2 — ABNORMAL HIGH (ref 0.8–1.2)
Prothrombin Time: 22.8 seconds — ABNORMAL HIGH (ref 11.4–15.2)

## 2022-12-21 NOTE — Telephone Encounter (Signed)
Patient gave verbal understanding and had no further questions or concerns  

## 2022-12-21 NOTE — Telephone Encounter (Signed)
-----   Message from Ladell Pier, MD sent at 12/21/2022  2:17 PM EST ----- Please call patient continue Coumadin at same dose, repeat PT/INR in 1 month

## 2023-01-04 ENCOUNTER — Other Ambulatory Visit: Payer: Self-pay | Admitting: Oncology

## 2023-01-05 ENCOUNTER — Telehealth: Payer: Self-pay | Admitting: *Deleted

## 2023-01-05 NOTE — Telephone Encounter (Signed)
Received refill request for warfarin. Called and left VM to confirm her current dosing: 5 mg daily?

## 2023-01-05 NOTE — Telephone Encounter (Signed)
Patient called back and confirmed her dose is 5 mg daily.

## 2023-01-18 ENCOUNTER — Other Ambulatory Visit: Payer: Self-pay

## 2023-01-18 ENCOUNTER — Inpatient Hospital Stay: Payer: Managed Care, Other (non HMO) | Attending: Oncology

## 2023-01-18 ENCOUNTER — Telehealth: Payer: Self-pay

## 2023-01-18 DIAGNOSIS — Z86718 Personal history of other venous thrombosis and embolism: Secondary | ICD-10-CM | POA: Diagnosis present

## 2023-01-18 DIAGNOSIS — I82402 Acute embolism and thrombosis of unspecified deep veins of left lower extremity: Secondary | ICD-10-CM | POA: Diagnosis present

## 2023-01-18 LAB — PROTIME-INR
INR: 2.4 — ABNORMAL HIGH (ref 0.8–1.2)
Prothrombin Time: 25.5 seconds — ABNORMAL HIGH (ref 11.4–15.2)

## 2023-01-18 NOTE — Telephone Encounter (Signed)
-----   Message from Ladell Pier, MD sent at 01/18/2023  1:46 PM EST ----- Please call patient, same Coumadin, repeat PT/INR in 1 month

## 2023-01-18 NOTE — Telephone Encounter (Signed)
Patient gave verbal understanding and had no further questions or concerns at this time 

## 2023-02-15 ENCOUNTER — Inpatient Hospital Stay: Payer: Managed Care, Other (non HMO) | Attending: Oncology

## 2023-02-15 DIAGNOSIS — Z86718 Personal history of other venous thrombosis and embolism: Secondary | ICD-10-CM | POA: Diagnosis present

## 2023-02-15 DIAGNOSIS — I82402 Acute embolism and thrombosis of unspecified deep veins of left lower extremity: Secondary | ICD-10-CM | POA: Diagnosis present

## 2023-02-15 LAB — PROTIME-INR
INR: 3.1 — ABNORMAL HIGH (ref 0.8–1.2)
Prothrombin Time: 31.9 seconds — ABNORMAL HIGH (ref 11.4–15.2)

## 2023-02-16 ENCOUNTER — Telehealth: Payer: Self-pay | Admitting: *Deleted

## 2023-02-16 ENCOUNTER — Other Ambulatory Visit: Payer: Managed Care, Other (non HMO)

## 2023-02-16 NOTE — Telephone Encounter (Signed)
Called patient w/therapeutic INR of 3.1 and to continue warfarin at current dose:  5 mg daily. Will recheck in 1 month. Appointment scheduled.

## 2023-03-15 ENCOUNTER — Inpatient Hospital Stay: Payer: Managed Care, Other (non HMO) | Attending: Oncology

## 2023-03-15 ENCOUNTER — Telehealth: Payer: Self-pay | Admitting: *Deleted

## 2023-03-15 DIAGNOSIS — Z86718 Personal history of other venous thrombosis and embolism: Secondary | ICD-10-CM | POA: Diagnosis present

## 2023-03-15 DIAGNOSIS — I82402 Acute embolism and thrombosis of unspecified deep veins of left lower extremity: Secondary | ICD-10-CM

## 2023-03-15 LAB — PROTIME-INR
INR: 2 — ABNORMAL HIGH (ref 0.8–1.2)
Prothrombin Time: 22.7 seconds — ABNORMAL HIGH (ref 11.4–15.2)

## 2023-03-15 NOTE — Telephone Encounter (Signed)
Informed patient that INR is therapeutic at 2.0. Per Dr. Benay Spice, continue warfarin at current 5 mg daily dose (confirmed w/patient). Recheck in 1 month

## 2023-04-07 ENCOUNTER — Telehealth: Payer: Self-pay

## 2023-04-07 NOTE — Telephone Encounter (Signed)
Spoke to pt's pharmacy approving the switch to generic coumadin per MD Truett Perna and Shon Hale, [facility]. Pt's next lab appt noted to be 04/19/2023.   Courtesy call to pt to confirm approval-no answer.

## 2023-04-19 ENCOUNTER — Inpatient Hospital Stay: Payer: Managed Care, Other (non HMO) | Attending: Oncology

## 2023-04-19 DIAGNOSIS — Z86718 Personal history of other venous thrombosis and embolism: Secondary | ICD-10-CM | POA: Diagnosis present

## 2023-04-19 DIAGNOSIS — I82402 Acute embolism and thrombosis of unspecified deep veins of left lower extremity: Secondary | ICD-10-CM | POA: Insufficient documentation

## 2023-04-19 LAB — PROTIME-INR
INR: 2.6 — ABNORMAL HIGH (ref 0.8–1.2)
Prothrombin Time: 28.5 seconds — ABNORMAL HIGH (ref 11.4–15.2)

## 2023-04-20 ENCOUNTER — Telehealth: Payer: Self-pay

## 2023-04-20 NOTE — Telephone Encounter (Signed)
Attempted to contact the patient but was unable to reach them. Left a message informing them that their PT/INR levels are within therapeutic range and to continue taking their prescribed dose of Coumadin.

## 2023-04-20 NOTE — Telephone Encounter (Signed)
-----   Message from Ladene Artist, MD sent at 04/19/2023  6:52 PM EDT ----- Please call patient, the PT/INR is therapeutic, continue Coumadin same dose, repeat PT/INR in 1 month

## 2023-04-20 NOTE — Telephone Encounter (Signed)
The message was sent via MyChart.

## 2023-05-17 ENCOUNTER — Inpatient Hospital Stay: Payer: Managed Care, Other (non HMO) | Attending: Oncology

## 2023-05-17 ENCOUNTER — Telehealth: Payer: Self-pay | Admitting: *Deleted

## 2023-05-17 DIAGNOSIS — Z86718 Personal history of other venous thrombosis and embolism: Secondary | ICD-10-CM | POA: Insufficient documentation

## 2023-05-17 DIAGNOSIS — I82402 Acute embolism and thrombosis of unspecified deep veins of left lower extremity: Secondary | ICD-10-CM | POA: Insufficient documentation

## 2023-05-17 LAB — PROTIME-INR

## 2023-05-17 NOTE — Telephone Encounter (Addendum)
CRITICAL VALUE STICKER  CRITICAL VALUE:INR 6.5  RECEIVER (on-site recipient of call):[name] [name]   DATE & TIME NOTIFIED: 05/17/2023 @ 0925  MESSENGER (representative from lab):Marchelle Folks  MD NOTIFIED: Dr. Truett Perna  TIME OF NOTIFICATION: 1610  RESPONSE:  Hold coumadin. Call for bleeding and recheck INR 05/19/23

## 2023-05-17 NOTE — Telephone Encounter (Signed)
Per Aram Beecham, takes 5 mg warfarin daily. No new meds, diet changes or weight loss. No bleeding. She will hold warfarin and return 6/7 for repeat INR

## 2023-05-17 NOTE — Telephone Encounter (Signed)
Was made aware by lab that multiple checks on sample gave different high results and machine was out of range. Re-check showed INR 2+,but sample was old.  [name] [name] will return tomorrow for INR check,but will still hold coumadin tonight. She will be credited for lab draw today.

## 2023-05-18 ENCOUNTER — Telehealth: Payer: Self-pay | Admitting: *Deleted

## 2023-05-18 ENCOUNTER — Inpatient Hospital Stay: Payer: Managed Care, Other (non HMO)

## 2023-05-18 ENCOUNTER — Telehealth: Payer: Self-pay

## 2023-05-18 ENCOUNTER — Other Ambulatory Visit: Payer: Self-pay

## 2023-05-18 DIAGNOSIS — I82402 Acute embolism and thrombosis of unspecified deep veins of left lower extremity: Secondary | ICD-10-CM

## 2023-05-18 DIAGNOSIS — Z86718 Personal history of other venous thrombosis and embolism: Secondary | ICD-10-CM | POA: Diagnosis present

## 2023-05-18 LAB — PROTIME-INR
INR: 1.6 — ABNORMAL HIGH (ref 0.8–1.2)
Prothrombin Time: 19.5 seconds — ABNORMAL HIGH (ref 11.4–15.2)

## 2023-05-18 NOTE — Telephone Encounter (Signed)
Coag machine at [facility] is down and sample sent to [facility] to run. Results not in. LVM for Ashanae that she will be called 1st thing in am w/results.

## 2023-05-18 NOTE — Telephone Encounter (Signed)
Patient gave verbal understanding and had no further questions or concerns  

## 2023-05-18 NOTE — Telephone Encounter (Signed)
-----   Message from Ladene Artist, MD sent at 05/18/2023  4:48 PM EDT ----- Please call patient, continue Coumadin at current dose, repeat PT/INR in 1 month

## 2023-05-19 ENCOUNTER — Telehealth: Payer: Self-pay | Admitting: *Deleted

## 2023-05-19 ENCOUNTER — Inpatient Hospital Stay: Payer: Managed Care, Other (non HMO)

## 2023-05-19 NOTE — Telephone Encounter (Signed)
Patient made aware of INR 1.6. Per Dr. Truett Perna: resume 5 mg daily and recheck on 7/3 as scheduled.

## 2023-06-14 ENCOUNTER — Inpatient Hospital Stay: Payer: Managed Care, Other (non HMO) | Attending: Oncology

## 2023-06-14 ENCOUNTER — Other Ambulatory Visit: Payer: Managed Care, Other (non HMO)

## 2023-06-14 ENCOUNTER — Telehealth: Payer: Self-pay | Admitting: *Deleted

## 2023-06-14 DIAGNOSIS — I82402 Acute embolism and thrombosis of unspecified deep veins of left lower extremity: Secondary | ICD-10-CM

## 2023-06-14 DIAGNOSIS — Z86718 Personal history of other venous thrombosis and embolism: Secondary | ICD-10-CM | POA: Insufficient documentation

## 2023-06-14 LAB — PROTIME-INR
INR: 1.4 — ABNORMAL HIGH (ref 0.8–1.2)
Prothrombin Time: 17.1 seconds — ABNORMAL HIGH (ref 11.4–15.2)

## 2023-06-14 NOTE — Telephone Encounter (Signed)
LVM for patient to please call on 7/5 to confirm her current warfarin dosing and if she has been taking it consistently. Is she on any vitamins or new medications or diet changes. Will re-check INR in 3 weeks. Scheduling message sent

## 2023-06-14 NOTE — Telephone Encounter (Signed)
-----   Message from Ladene Artist, MD sent at 06/14/2023  4:26 PM EDT ----- Please call patient, be sure she is taking the Coumadin, repeat PT/INR in 3-4 weeks

## 2023-06-16 ENCOUNTER — Telehealth: Payer: Self-pay | Admitting: Oncology

## 2023-06-16 NOTE — Telephone Encounter (Signed)
Scheduled appointment per scheduling message. Left voicemail for patient.  

## 2023-07-05 ENCOUNTER — Other Ambulatory Visit: Payer: Self-pay | Admitting: Oncology

## 2023-07-05 ENCOUNTER — Telehealth: Payer: Self-pay | Admitting: *Deleted

## 2023-07-05 ENCOUNTER — Inpatient Hospital Stay: Payer: Managed Care, Other (non HMO)

## 2023-07-05 DIAGNOSIS — I82402 Acute embolism and thrombosis of unspecified deep veins of left lower extremity: Secondary | ICD-10-CM

## 2023-07-05 DIAGNOSIS — Z86718 Personal history of other venous thrombosis and embolism: Secondary | ICD-10-CM | POA: Diagnosis not present

## 2023-07-05 LAB — PROTIME-INR
INR: 2.4 — ABNORMAL HIGH (ref 0.8–1.2)
Prothrombin Time: 26.5 seconds — ABNORMAL HIGH (ref 11.4–15.2)

## 2023-07-05 NOTE — Telephone Encounter (Signed)
Notified patient that INR is therapeutic at 2.4. Continue wafarin 5 mg daily. Refill approved as requested.

## 2023-07-05 NOTE — Telephone Encounter (Signed)
-----   Message from Thornton Papas sent at 07/05/2023  4:12 PM EDT ----- Please call patient, the PT/INR is in the therapeutic range, continue Coumadin at current dose, repeat PT/INR in 1 month

## 2023-07-07 ENCOUNTER — Inpatient Hospital Stay: Payer: Managed Care, Other (non HMO)

## 2023-07-17 IMAGING — DX DG ANKLE COMPLETE 3+V*R*
1 series · 3 of 3 positions shown · non-contrast
Comparison: None.

CLINICAL DATA: 59-year-old female with right ankle pain and foot
pain this morning. Unable to weightbear.

EXAM:
RIGHT ANKLE - COMPLETE 3+ VIEW

[Series 1: ankle · 0.14mm/px · 3 of 3 slices shown]
[im 1/3]
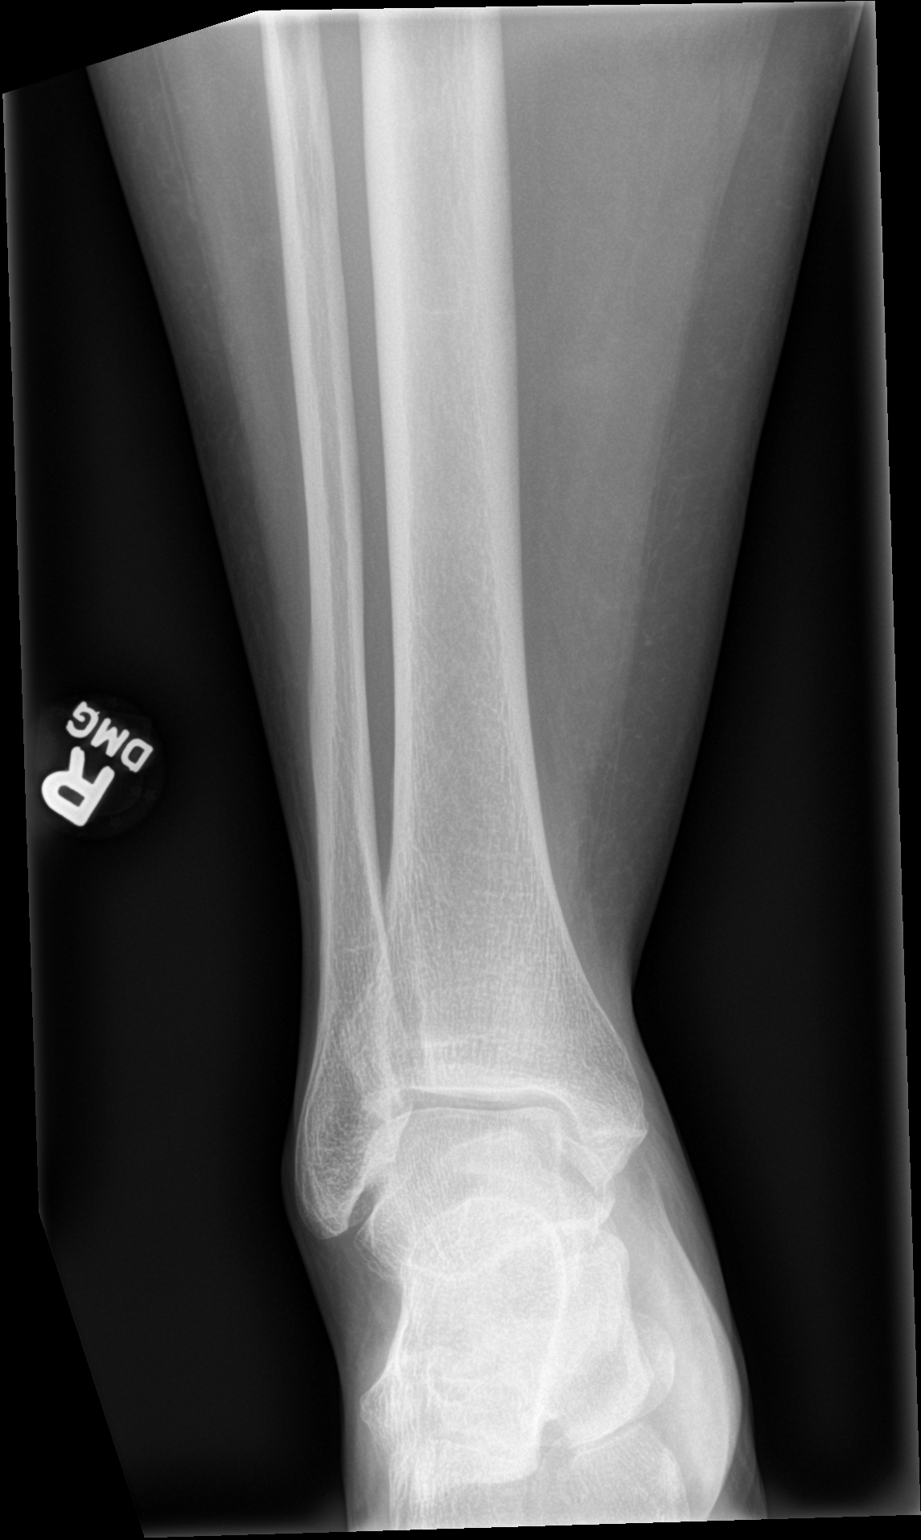
[im 2/3]
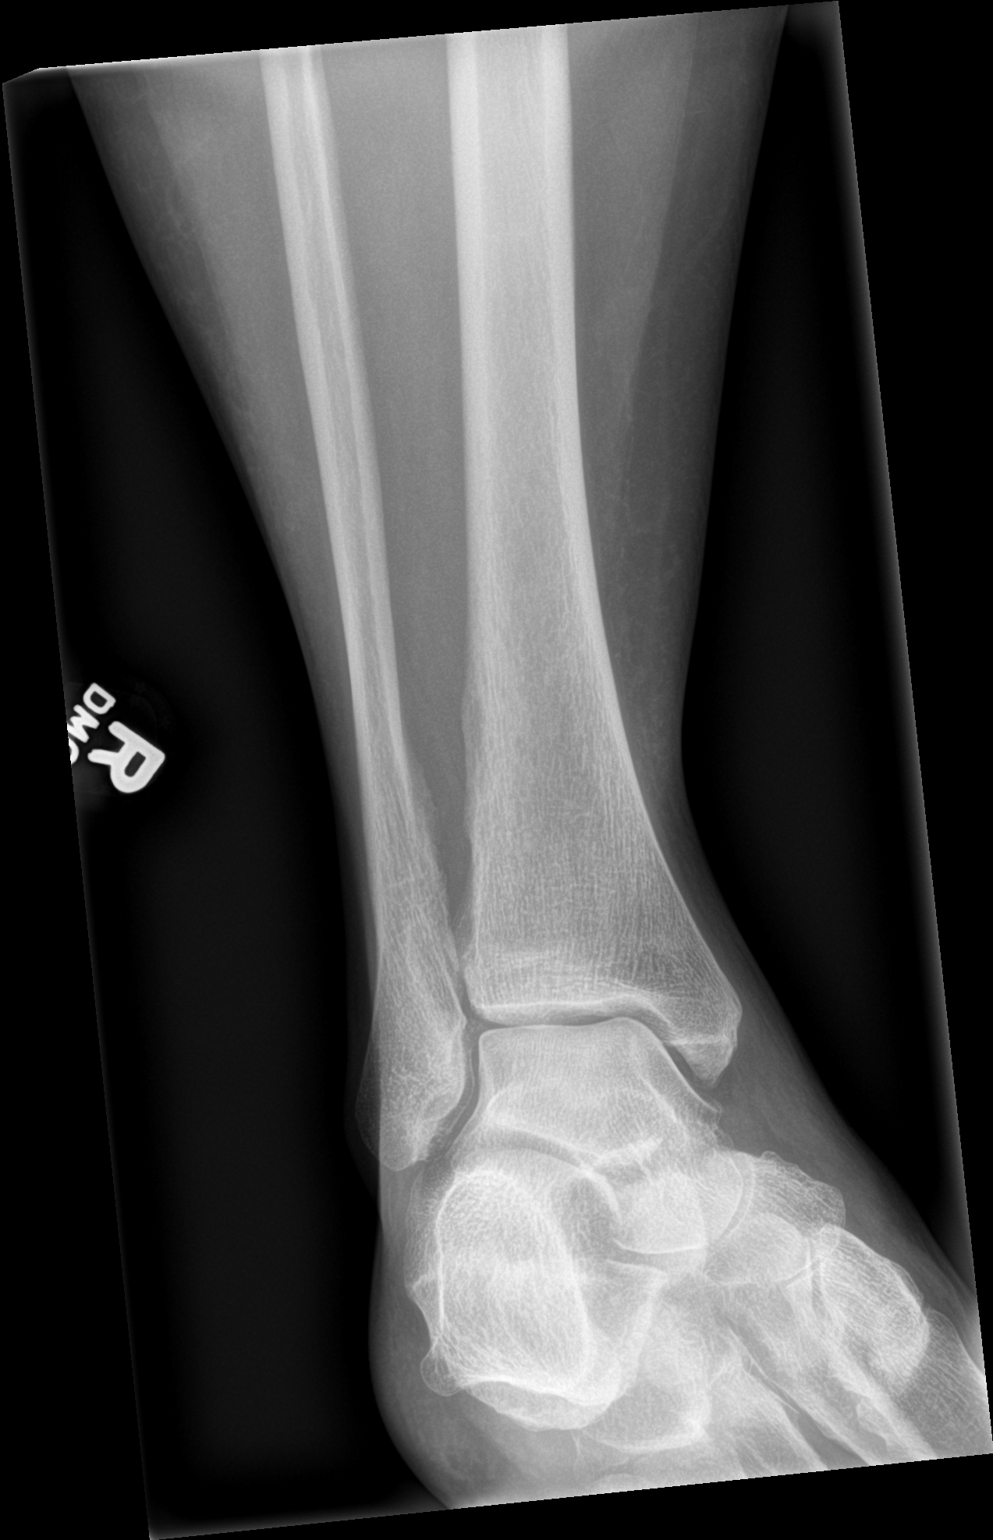
[im 3/3]
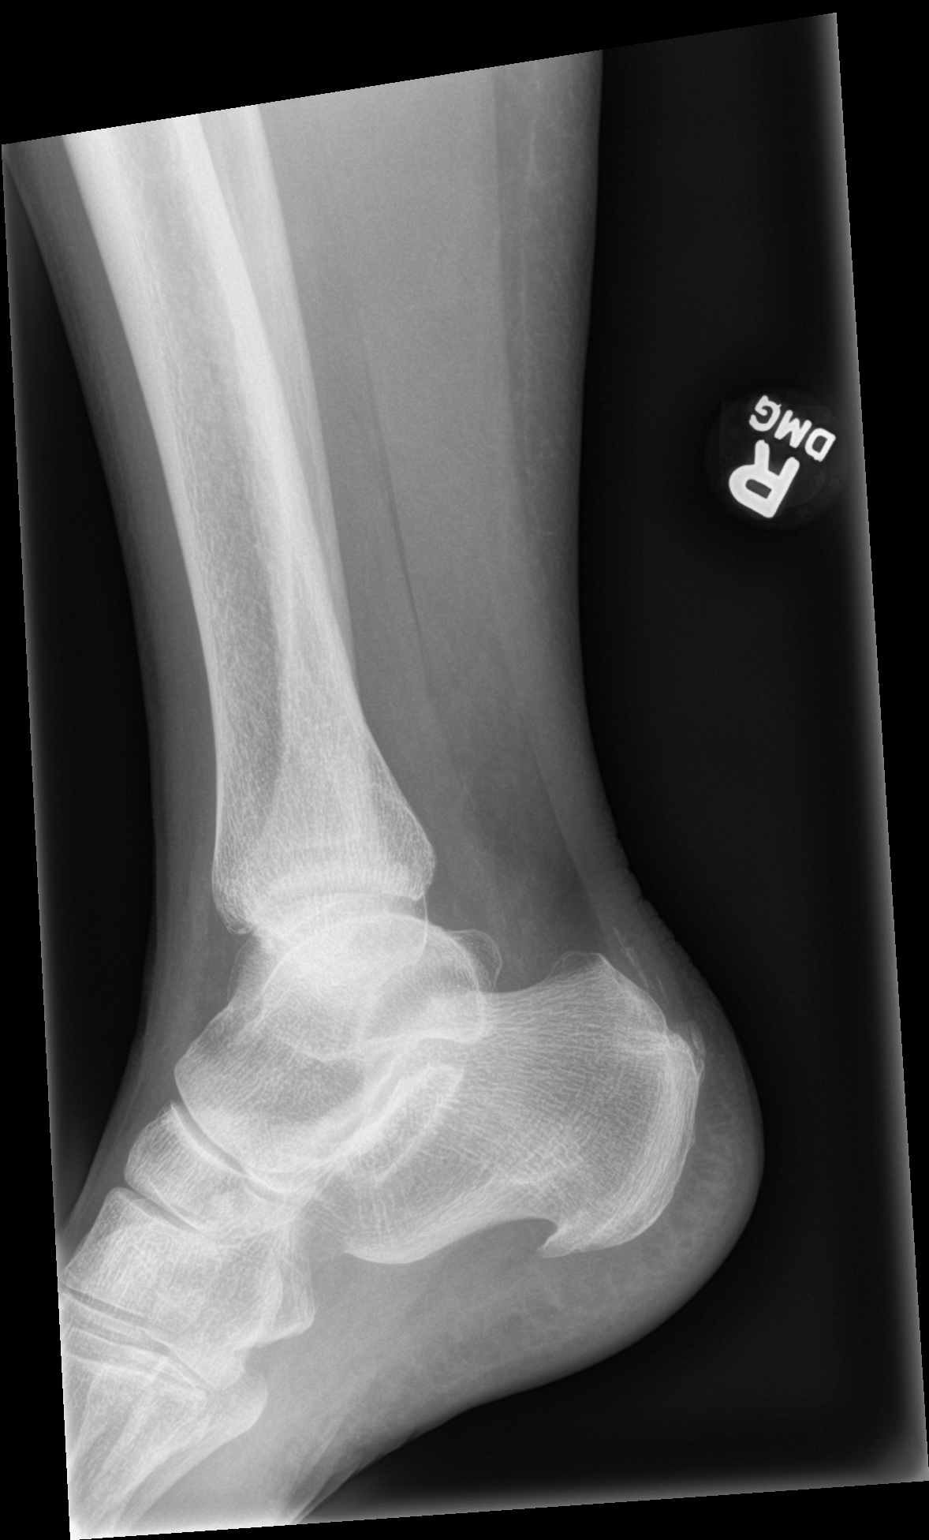

[3 of 3 positions shown; findings below may reference images not displayed]

FINDINGS: Bone mineralization is within normal limits. Preserved mortise joint
alignment. Talar dome intact. No joint effusion. Calcaneus intact
with prominent degenerative spurring. No acute osseous abnormality
identified.
IMPRESSION: No acute osseous abnormality identified about the right ankle.

## 2023-08-30 ENCOUNTER — Inpatient Hospital Stay: Payer: Managed Care, Other (non HMO)

## 2023-08-30 ENCOUNTER — Inpatient Hospital Stay: Payer: Managed Care, Other (non HMO) | Attending: Oncology | Admitting: Oncology

## 2023-08-30 VITALS — BP 143/64 | HR 71 | Temp 98.1°F | Resp 18 | Ht 67.0 in | Wt 233.7 lb

## 2023-08-30 DIAGNOSIS — M79605 Pain in left leg: Secondary | ICD-10-CM | POA: Diagnosis not present

## 2023-08-30 DIAGNOSIS — D6862 Lupus anticoagulant syndrome: Secondary | ICD-10-CM | POA: Diagnosis not present

## 2023-08-30 DIAGNOSIS — Z79899 Other long term (current) drug therapy: Secondary | ICD-10-CM | POA: Diagnosis not present

## 2023-08-30 DIAGNOSIS — I82402 Acute embolism and thrombosis of unspecified deep veins of left lower extremity: Secondary | ICD-10-CM

## 2023-08-30 DIAGNOSIS — Z86718 Personal history of other venous thrombosis and embolism: Secondary | ICD-10-CM | POA: Insufficient documentation

## 2023-08-30 DIAGNOSIS — G8929 Other chronic pain: Secondary | ICD-10-CM | POA: Diagnosis not present

## 2023-08-30 DIAGNOSIS — Z7901 Long term (current) use of anticoagulants: Secondary | ICD-10-CM | POA: Insufficient documentation

## 2023-08-30 DIAGNOSIS — M79604 Pain in right leg: Secondary | ICD-10-CM | POA: Insufficient documentation

## 2023-08-30 DIAGNOSIS — N912 Amenorrhea, unspecified: Secondary | ICD-10-CM | POA: Diagnosis not present

## 2023-08-30 LAB — PROTIME-INR
INR: 1.8 — ABNORMAL HIGH (ref 0.8–1.2)
Prothrombin Time: 21.3 s — ABNORMAL HIGH (ref 11.4–15.2)

## 2023-08-30 NOTE — Progress Notes (Signed)
  [facility] OFFICE PROGRESS NOTE   Diagnosis: Chronic Coumadin anticoagulation, history of venous thrombosis  INTERVAL HISTORY:   [name] [name] returns as scheduled.  She feels well.  No complaint.  No bleeding.  No symptom of recurrent thrombosis.  She continues Coumadin anticoagulation.  Objective:  Vital signs in last 24 hours:  Blood pressure (!) 143/64, pulse 71, temperature 98.1 F (36.7 C), temperature source Skin, resp. rate 18, height 5\' 7"  (1.702 m), weight 233 lb 11.2 oz (106 kg), SpO2 100%.    Resp: Lungs clear bilaterally Cardio: Regular rate and rhythm GI: No hepatosplenomegaly Vascular: No leg edema, left lower leg is slightly larger than the right side  Lab Results:  Lab Results  Component Value Date   INR 1.8 (H) 08/30/2023   LABPROT 21.3 (H) 08/30/2023    Medications: I have reviewed the patient's current medications.   Assessment/Plan:    History of recurrent lower extremity deep vein thrombosis, maintained on Coumadin anticoagulation. 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 Repeat lupus and anticardiolipin antibody testing was negative until there was a positive lupus anticoagulant panel on 05/27/2008. There was a positive lupus anticoagulant and hexagonal phase study on 10/27/2009. She would like to continue Coumadin anticoagulation.   History of Anemia secondary to menorrhagia and iron deficiency. She is now amenorrheic. Chronic bilateral leg pain.  [name] [name] appears stable.  She is maintained on chronic Coumadin anticoagulation.  We discussed the indication for additional testing and possibly discontinuing anticoagulation.  She would like to continue anticoagulation therapy.  She does not wish to consider a change to a DOAC.  She will continue Coumadin.  We continue monitoring the PT/INR and will adjust the Coumadin dose as indicated.  She will call for bleeding or symptoms of thrombosis.   [name] [name] will return for an office visit in 1 year.  Thornton Papas, MD  08/30/2023  9:11 AM

## 2023-10-11 ENCOUNTER — Other Ambulatory Visit: Payer: Self-pay

## 2023-10-11 ENCOUNTER — Inpatient Hospital Stay: Payer: Managed Care, Other (non HMO) | Attending: Oncology

## 2023-10-11 DIAGNOSIS — D649 Anemia, unspecified: Secondary | ICD-10-CM

## 2023-10-11 DIAGNOSIS — I82402 Acute embolism and thrombosis of unspecified deep veins of left lower extremity: Secondary | ICD-10-CM

## 2023-10-11 DIAGNOSIS — Z86718 Personal history of other venous thrombosis and embolism: Secondary | ICD-10-CM | POA: Insufficient documentation

## 2023-10-11 LAB — PROTIME-INR
INR: 2.1 — ABNORMAL HIGH (ref 0.8–1.2)
Prothrombin Time: 23.7 s — ABNORMAL HIGH (ref 11.4–15.2)

## 2023-11-08 ENCOUNTER — Inpatient Hospital Stay: Payer: Managed Care, Other (non HMO) | Attending: Oncology

## 2023-11-08 DIAGNOSIS — Z86718 Personal history of other venous thrombosis and embolism: Secondary | ICD-10-CM | POA: Diagnosis present

## 2023-11-08 DIAGNOSIS — Z79899 Other long term (current) drug therapy: Secondary | ICD-10-CM | POA: Diagnosis not present

## 2023-11-08 DIAGNOSIS — D649 Anemia, unspecified: Secondary | ICD-10-CM | POA: Insufficient documentation

## 2023-11-08 LAB — PROTIME-INR
INR: 1.7 — ABNORMAL HIGH (ref 0.8–1.2)
Prothrombin Time: 20.3 s — ABNORMAL HIGH (ref 11.4–15.2)

## 2023-11-14 ENCOUNTER — Telehealth: Payer: Self-pay | Admitting: *Deleted

## 2023-11-14 DIAGNOSIS — I82402 Acute embolism and thrombosis of unspecified deep veins of left lower extremity: Secondary | ICD-10-CM

## 2023-11-14 NOTE — Telephone Encounter (Signed)
-----   Message from Thornton Papas sent at 11/08/2023  3:02 PM EST ----- Please call patient, continue same dose, repeat PT/INR in 1 month

## 2023-11-14 NOTE — Telephone Encounter (Signed)
Magnus Ivan w/INR results. Confirmed she is on 5 mg warfarin daily. She will continue this dose and recheck INR on 12/19/22 (needs Wednesdays)

## 2023-12-20 ENCOUNTER — Inpatient Hospital Stay: Payer: Managed Care, Other (non HMO) | Attending: Oncology

## 2023-12-20 DIAGNOSIS — Z86718 Personal history of other venous thrombosis and embolism: Secondary | ICD-10-CM | POA: Insufficient documentation

## 2023-12-20 DIAGNOSIS — I82402 Acute embolism and thrombosis of unspecified deep veins of left lower extremity: Secondary | ICD-10-CM

## 2023-12-20 LAB — PROTIME-INR
INR: 3.2 — ABNORMAL HIGH (ref 0.8–1.2)
Prothrombin Time: 33 s — ABNORMAL HIGH (ref 11.4–15.2)

## 2023-12-22 ENCOUNTER — Telehealth: Payer: Self-pay | Admitting: *Deleted

## 2023-12-22 DIAGNOSIS — I82402 Acute embolism and thrombosis of unspecified deep veins of left lower extremity: Secondary | ICD-10-CM

## 2023-12-22 NOTE — Telephone Encounter (Signed)
-----   Message from Thornton Papas sent at 12/20/2023  2:54 PM EST ----- Please call patient, the PT/INR is slightly above goal range, has she taken any recent new medications?,  Continue Coumadin at current dose, repeat PT/INR in 2 weeks

## 2023-12-22 NOTE — Telephone Encounter (Signed)
 Notified of elevated INR via VM and inquired if she has started any new medications recently, such as an antibiotic. Continue current dose of 5 mg daily and return in 2 weeks to recheck. Requested she return call to confirm. Scheduling message sent.

## 2024-01-05 ENCOUNTER — Other Ambulatory Visit: Payer: Self-pay | Admitting: Oncology

## 2024-01-08 ENCOUNTER — Inpatient Hospital Stay: Payer: Managed Care, Other (non HMO)

## 2024-01-10 ENCOUNTER — Inpatient Hospital Stay: Payer: Managed Care, Other (non HMO)

## 2024-01-10 ENCOUNTER — Other Ambulatory Visit: Payer: Managed Care, Other (non HMO)

## 2024-01-10 DIAGNOSIS — I82402 Acute embolism and thrombosis of unspecified deep veins of left lower extremity: Secondary | ICD-10-CM

## 2024-01-10 DIAGNOSIS — Z86718 Personal history of other venous thrombosis and embolism: Secondary | ICD-10-CM | POA: Diagnosis not present

## 2024-01-10 LAB — PROTIME-INR
INR: 2.4 — ABNORMAL HIGH (ref 0.8–1.2)
Prothrombin Time: 26.2 s — ABNORMAL HIGH (ref 11.4–15.2)

## 2024-01-11 ENCOUNTER — Telehealth: Payer: Self-pay | Admitting: *Deleted

## 2024-01-11 NOTE — Telephone Encounter (Signed)
LVM that INR is therapeutic. Continue warfarin 5 mg daily and recheck INR in 1 month. Scheduling message sent.

## 2024-01-11 NOTE — Telephone Encounter (Signed)
-----   Message from Thornton Papas sent at 01/10/2024 10:19 AM EST ----- Please call patient, the PT/INR is in the therapeutic range, continue Coumadin at the current dose, check PT/INR in 1 month

## 2024-02-07 ENCOUNTER — Inpatient Hospital Stay: Payer: Managed Care, Other (non HMO) | Attending: Oncology

## 2024-02-07 DIAGNOSIS — Z86718 Personal history of other venous thrombosis and embolism: Secondary | ICD-10-CM | POA: Diagnosis present

## 2024-02-07 DIAGNOSIS — I82402 Acute embolism and thrombosis of unspecified deep veins of left lower extremity: Secondary | ICD-10-CM

## 2024-02-07 LAB — PROTIME-INR
INR: 2.4 — ABNORMAL HIGH (ref 0.8–1.2)
Prothrombin Time: 26.7 s — ABNORMAL HIGH (ref 11.4–15.2)

## 2024-02-08 ENCOUNTER — Telehealth: Payer: Self-pay | Admitting: *Deleted

## 2024-02-08 DIAGNOSIS — I82402 Acute embolism and thrombosis of unspecified deep veins of left lower extremity: Secondary | ICD-10-CM

## 2024-02-08 NOTE — Telephone Encounter (Signed)
 Notified of INR. Continue warfarin at 5 mg daily and recheck lab on 4/9 at 0930. She agrees.

## 2024-02-08 NOTE — Telephone Encounter (Signed)
-----   Message from Thornton Papas sent at 02/07/2024  6:47 PM EST ----- Please call patient, same coumadin, check PT INR in 6 weeks

## 2024-03-20 ENCOUNTER — Telehealth: Payer: Self-pay | Admitting: *Deleted

## 2024-03-20 ENCOUNTER — Inpatient Hospital Stay: Payer: Managed Care, Other (non HMO) | Attending: Oncology

## 2024-03-20 DIAGNOSIS — Z86718 Personal history of other venous thrombosis and embolism: Secondary | ICD-10-CM | POA: Diagnosis present

## 2024-03-20 DIAGNOSIS — I82402 Acute embolism and thrombosis of unspecified deep veins of left lower extremity: Secondary | ICD-10-CM

## 2024-03-20 LAB — PROTIME-INR
INR: 2.6 — ABNORMAL HIGH (ref 0.8–1.2)
Prothrombin Time: 28.3 s — ABNORMAL HIGH (ref 11.4–15.2)

## 2024-03-20 NOTE — Telephone Encounter (Signed)
 Notified that INR 2.6. Per MD continue warfarin at 5 mg daily and recheck in 1 month. Scheduled for 5/7 at 0845

## 2024-03-20 NOTE — Telephone Encounter (Signed)
-----   Message from Thornton Papas sent at 03/20/2024 12:05 PM EDT ----- Please call patient, continue Coumadin at same dose, check PT/INR in 1 month

## 2024-04-17 ENCOUNTER — Inpatient Hospital Stay: Attending: Oncology

## 2024-04-17 ENCOUNTER — Telehealth: Payer: Self-pay | Admitting: *Deleted

## 2024-04-17 DIAGNOSIS — Z86718 Personal history of other venous thrombosis and embolism: Secondary | ICD-10-CM | POA: Insufficient documentation

## 2024-04-17 DIAGNOSIS — I82402 Acute embolism and thrombosis of unspecified deep veins of left lower extremity: Secondary | ICD-10-CM

## 2024-04-17 LAB — PROTIME-INR
INR: 2.4 — ABNORMAL HIGH (ref 0.8–1.2)
Prothrombin Time: 26.1 s — ABNORMAL HIGH (ref 11.4–15.2)

## 2024-04-17 NOTE — Telephone Encounter (Signed)
 Notified of therapeutic INR and to continue warfarin at 5 mg daily. Recheck INR 6/18 at 0845. She agrees.

## 2024-04-17 NOTE — Telephone Encounter (Signed)
-----   Message from Coni Deep sent at 04/17/2024 11:18 AM EDT ----- Please call patient, the PT/INR is in the therapeutic range, continue Coumadin  at the current dose, repeat PT/INR in 4-6 weeks

## 2024-05-29 ENCOUNTER — Ambulatory Visit: Payer: Self-pay | Admitting: Oncology

## 2024-05-29 ENCOUNTER — Inpatient Hospital Stay: Attending: Oncology

## 2024-05-29 DIAGNOSIS — Z86718 Personal history of other venous thrombosis and embolism: Secondary | ICD-10-CM | POA: Diagnosis present

## 2024-05-29 DIAGNOSIS — I82402 Acute embolism and thrombosis of unspecified deep veins of left lower extremity: Secondary | ICD-10-CM | POA: Diagnosis not present

## 2024-05-29 LAB — PROTIME-INR
INR: 2.1 — ABNORMAL HIGH (ref 0.8–1.2)
Prothrombin Time: 23.8 s — ABNORMAL HIGH (ref 11.4–15.2)

## 2024-05-29 NOTE — Telephone Encounter (Signed)
-----   Message from Coni Deep sent at 05/29/2024 10:27 AM EDT ----- Please call patient, continue Coumadin  at the same dose, follow-up for PT/INR in 1 month  ----- Message ----- From: Interface, Lab In [place] Sent: 05/29/2024   8:54 AM EDT To: Sumner Ends, MD

## 2024-05-29 NOTE — Telephone Encounter (Signed)
 Patient verbalized understanding. Scheduler will reach out for follow-up 1 month lab appointment. No further questions.

## 2024-06-12 ENCOUNTER — Telehealth: Payer: Self-pay | Admitting: Oncology

## 2024-06-12 NOTE — Telephone Encounter (Signed)
 Patient has been scheduled. Aware of appt date and time.    Scheduling Message Entered by DYANA SALINES L on 05/29/2024 at  2:22 PM Priority: Routine <No visit type provided>  Department: [facility]  Provider:  Scheduling Notes:  Can you schedule this patient a  PT/INR lab for 1 month per Dr. Cloretta request.

## 2024-06-26 ENCOUNTER — Other Ambulatory Visit: Payer: Self-pay

## 2024-06-26 ENCOUNTER — Inpatient Hospital Stay: Attending: Oncology

## 2024-06-26 ENCOUNTER — Ambulatory Visit: Payer: Self-pay | Admitting: Oncology

## 2024-06-26 DIAGNOSIS — Z86718 Personal history of other venous thrombosis and embolism: Secondary | ICD-10-CM | POA: Diagnosis present

## 2024-06-26 DIAGNOSIS — I82402 Acute embolism and thrombosis of unspecified deep veins of left lower extremity: Secondary | ICD-10-CM

## 2024-06-26 LAB — PROTIME-INR
INR: 2.2 — ABNORMAL HIGH (ref 0.8–1.2)
Prothrombin Time: 25.4 s — ABNORMAL HIGH (ref 11.4–15.2)

## 2024-06-26 NOTE — Telephone Encounter (Signed)
-----   Message from Arley Hof sent at 06/26/2024  1:25 PM EDT ----- Please call patient, continue Coumadin  at the same dose, repeat PT/INR in 1 month  ----- Message ----- From: Interface, Lab In [place] Sent: 06/26/2024   9:09 AM EDT To: Arley KATHEE Hof, MD

## 2024-06-26 NOTE — Telephone Encounter (Signed)
 Verbally voiced understanding, no further questions. Scheduler will reach out to make lab appointment.

## 2024-07-06 ENCOUNTER — Other Ambulatory Visit: Payer: Self-pay | Admitting: Oncology

## 2024-07-09 ENCOUNTER — Other Ambulatory Visit: Payer: Self-pay | Admitting: Oncology

## 2024-07-24 ENCOUNTER — Other Ambulatory Visit: Payer: Self-pay

## 2024-07-24 ENCOUNTER — Inpatient Hospital Stay: Attending: Oncology

## 2024-07-24 ENCOUNTER — Ambulatory Visit: Payer: Self-pay | Admitting: Oncology

## 2024-07-24 DIAGNOSIS — I82402 Acute embolism and thrombosis of unspecified deep veins of left lower extremity: Secondary | ICD-10-CM | POA: Insufficient documentation

## 2024-07-24 DIAGNOSIS — Z86718 Personal history of other venous thrombosis and embolism: Secondary | ICD-10-CM | POA: Diagnosis present

## 2024-07-24 LAB — PROTIME-INR
INR: 1.8 — ABNORMAL HIGH (ref 0.8–1.2)
Prothrombin Time: 22.2 s — ABNORMAL HIGH (ref 11.4–15.2)

## 2024-08-21 ENCOUNTER — Ambulatory Visit: Payer: Self-pay | Admitting: Oncology

## 2024-08-21 ENCOUNTER — Inpatient Hospital Stay: Attending: Oncology

## 2024-08-21 DIAGNOSIS — G8929 Other chronic pain: Secondary | ICD-10-CM | POA: Diagnosis not present

## 2024-08-21 DIAGNOSIS — N920 Excessive and frequent menstruation with regular cycle: Secondary | ICD-10-CM | POA: Insufficient documentation

## 2024-08-21 DIAGNOSIS — M25562 Pain in left knee: Secondary | ICD-10-CM | POA: Insufficient documentation

## 2024-08-21 DIAGNOSIS — D500 Iron deficiency anemia secondary to blood loss (chronic): Secondary | ICD-10-CM | POA: Insufficient documentation

## 2024-08-21 DIAGNOSIS — I82402 Acute embolism and thrombosis of unspecified deep veins of left lower extremity: Secondary | ICD-10-CM | POA: Insufficient documentation

## 2024-08-21 DIAGNOSIS — Z7901 Long term (current) use of anticoagulants: Secondary | ICD-10-CM | POA: Insufficient documentation

## 2024-08-21 DIAGNOSIS — D6862 Lupus anticoagulant syndrome: Secondary | ICD-10-CM | POA: Insufficient documentation

## 2024-08-21 DIAGNOSIS — Z79899 Other long term (current) drug therapy: Secondary | ICD-10-CM | POA: Insufficient documentation

## 2024-08-21 DIAGNOSIS — Z86718 Personal history of other venous thrombosis and embolism: Secondary | ICD-10-CM | POA: Diagnosis present

## 2024-08-21 DIAGNOSIS — M25561 Pain in right knee: Secondary | ICD-10-CM | POA: Insufficient documentation

## 2024-08-21 LAB — PROTIME-INR
INR: 2.3 — ABNORMAL HIGH (ref 0.8–1.2)
Prothrombin Time: 26 s — ABNORMAL HIGH (ref 11.4–15.2)

## 2024-08-23 NOTE — Telephone Encounter (Signed)
 Patient gave verbal understanding and had no further questions or concerns

## 2024-08-23 NOTE — Telephone Encounter (Signed)
-----   Message from [name] [name] sent at 08/21/2024  1:56 PM EDT ----- Please call patient, continue Coumadin  same dose, repeat PT/INR in 4 weeks  ----- Message ----- From: Rebecka, Lab In [place] Sent: 08/21/2024   8:12 AM EDT To: [name] KATHEE Hof, MD

## 2024-09-03 ENCOUNTER — Other Ambulatory Visit: Payer: Self-pay | Admitting: *Deleted

## 2024-09-03 DIAGNOSIS — I82402 Acute embolism and thrombosis of unspecified deep veins of left lower extremity: Secondary | ICD-10-CM

## 2024-09-04 ENCOUNTER — Inpatient Hospital Stay: Payer: Self-pay

## 2024-09-04 ENCOUNTER — Inpatient Hospital Stay (HOSPITAL_BASED_OUTPATIENT_CLINIC_OR_DEPARTMENT_OTHER): Payer: Managed Care, Other (non HMO) | Admitting: Oncology

## 2024-09-04 ENCOUNTER — Inpatient Hospital Stay: Payer: Managed Care, Other (non HMO)

## 2024-09-04 VITALS — BP 147/78 | HR 73 | Temp 98.1°F | Resp 18 | Ht 67.0 in | Wt 227.0 lb

## 2024-09-04 DIAGNOSIS — I82402 Acute embolism and thrombosis of unspecified deep veins of left lower extremity: Secondary | ICD-10-CM | POA: Diagnosis not present

## 2024-09-04 LAB — PROTIME-INR
INR: 1.6 — ABNORMAL HIGH (ref 0.8–1.2)
Prothrombin Time: 20.3 s — ABNORMAL HIGH (ref 11.4–15.2)

## 2024-09-04 NOTE — Progress Notes (Signed)
  [facility] OFFICE PROGRESS NOTE   Diagnosis: Chronic anticoagulation  INTERVAL HISTORY:   [name] [name] returns as scheduled.  She feels well.  She continues Coumadin .  No bleeding or symptom of thrombosis.  She has bilateral knee pain.  She reports intentional weight loss.  She has a good appetite.  No difficulty with bowel or bladder function.  No bleeding.  Objective:  Vital signs in last 24 hours:  Blood pressure (!) 147/78, pulse 73, temperature 98.1 F (36.7 C), temperature source Temporal, resp. rate 18, height 5' 7 (1.702 m), weight 227 lb (103 kg), SpO2 100%.   Resp: Lungs clear bilaterally Cardio: Regular rate and rhythm GI: No hepatosplenomegaly Vascular: No leg edema Musculoskeletal: Bilateral valgus deformity at the knee  Lab Results:  Lab Results  Component Value Date   WBC 5.6 10/27/2021   HGB 12.8 10/27/2021   HCT 39.6 10/27/2021   MCV 89.8 10/27/2021   PLT 322 10/27/2021   NEUTROABS 2.9 10/27/2021    CMP  Lab Results  Component Value Date   NA 134 (L) 03/19/2020   K 3.4 (L) 03/19/2020   CL 97 (L) 03/19/2020   CO2 25 03/19/2020   GLUCOSE 112 (H) 03/19/2020   BUN 18 03/19/2020   CREATININE 0.74 03/19/2020   CALCIUM 8.7 (L) 03/19/2020   PROT 8.1 01/07/2016   ALBUMIN 4.5 01/07/2016   AST 29 01/07/2016   ALT 33 01/07/2016   ALKPHOS 88 01/07/2016   BILITOT 0.5 01/07/2016   GFRNONAA >60 03/19/2020   GFRAA >60 03/19/2020    Lab Results  Component Value Date   INR 1.6 (H) 09/04/2024   LABPROT 20.3 (H) 09/04/2024    Medications: I have reviewed the patient's current medications.   Assessment/Plan: History of recurrent lower extremity deep vein thrombosis, maintained on Coumadin  anticoagulation. 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 Repeat lupus and anticardiolipin antibody testing was negative until there was a positive lupus anticoagulant panel on 05/27/2008. There was a positive  lupus anticoagulant and hexagonal phase study on 10/27/2009. She would like to continue Coumadin  anticoagulation.   History of Anemia secondary to menorrhagia and iron deficiency. She is now amenorrheic. Chronic bilateral leg pain.    Disposition: [name] [name] is maintained on chronic Coumadin  anticoagulation with a history of recurrent lower extremity deep vein thrombosis.  She has a history of a positive lupus anticoagulant.  We discussed the option of repeat anti-phospholipid testing to see whether she may be a candidate to discontinue anticoagulation.  We also discussed changing from Coumadin  to a DOAC.  She would like to continue Coumadin  anticoagulation.  I encouraged her to follow-up with Dr. Nanci for internal medicine care and cancer screening.  I recommended she schedule a mammogram and colon cancer screening.  She will continue Coumadin  at the current dose.  The PT/INR has generally been therapeutic on the current dose.  She will return for a PT/INR in one 1 month.  She will be scheduled for a 1 year office visit.  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  I offered to make an orthopedic referral, but she does not wish to pursue this at present.  Arley Hof, MD  09/04/2024  8:45 AM

## 2024-09-05 ENCOUNTER — Other Ambulatory Visit (HOSPITAL_BASED_OUTPATIENT_CLINIC_OR_DEPARTMENT_OTHER): Payer: Self-pay

## 2024-10-02 ENCOUNTER — Other Ambulatory Visit: Payer: Self-pay | Admitting: Oncology

## 2024-10-02 ENCOUNTER — Inpatient Hospital Stay: Attending: Oncology

## 2024-10-02 ENCOUNTER — Ambulatory Visit: Payer: Self-pay | Admitting: Oncology

## 2024-10-02 DIAGNOSIS — I82402 Acute embolism and thrombosis of unspecified deep veins of left lower extremity: Secondary | ICD-10-CM | POA: Diagnosis present

## 2024-10-02 DIAGNOSIS — Z86718 Personal history of other venous thrombosis and embolism: Secondary | ICD-10-CM | POA: Insufficient documentation

## 2024-10-02 LAB — PROTIME-INR
INR: 1.8 — ABNORMAL HIGH (ref 0.8–1.2)
Prothrombin Time: 21.4 s — ABNORMAL HIGH (ref 11.4–15.2)

## 2024-10-02 NOTE — Telephone Encounter (Signed)
-----   Message from Arley Hof sent at 10/02/2024  9:09 AM EDT ----- Please call patient, continue Coumadin  at the same dose, repeat PT/INR in 1 month  ----- Message ----- From: Interface, Lab In [place] Sent: 10/02/2024   8:15 AM EDT To: Arley KATHEE Hof, MD

## 2024-10-02 NOTE — Telephone Encounter (Signed)
 Patient gave verbal understanding and had no further questions.

## 2024-10-25 ENCOUNTER — Other Ambulatory Visit: Payer: Self-pay | Admitting: *Deleted

## 2024-10-25 ENCOUNTER — Encounter: Payer: Self-pay | Admitting: *Deleted

## 2024-10-25 DIAGNOSIS — I82402 Acute embolism and thrombosis of unspecified deep veins of left lower extremity: Secondary | ICD-10-CM

## 2024-10-30 ENCOUNTER — Inpatient Hospital Stay: Attending: Oncology

## 2024-10-30 DIAGNOSIS — Z86718 Personal history of other venous thrombosis and embolism: Secondary | ICD-10-CM | POA: Insufficient documentation

## 2024-10-30 DIAGNOSIS — I82402 Acute embolism and thrombosis of unspecified deep veins of left lower extremity: Secondary | ICD-10-CM | POA: Diagnosis present

## 2024-10-30 DIAGNOSIS — Z79899 Other long term (current) drug therapy: Secondary | ICD-10-CM | POA: Insufficient documentation

## 2024-10-30 LAB — PROTIME-INR
INR: 1.5 — ABNORMAL HIGH (ref 0.8–1.2)
Prothrombin Time: 19.2 s — ABNORMAL HIGH (ref 11.4–15.2)

## 2024-10-31 ENCOUNTER — Ambulatory Visit: Payer: Self-pay | Admitting: Oncology

## 2024-10-31 NOTE — Telephone Encounter (Signed)
-----   Message from Arley Hof sent at 10/31/2024  1:29 PM EST ----- Please call patient, has she missed any Coumadin  doses, the PT/INR is subtherapeutic and has been low for the past few visits, continue Coumadin  at the current dose, repeat PT/INR in 4 weeks, we will  consider increasing the Coumadin  dose if the INR is low in 4 weeks  ----- Message ----- From: Rebecka, Lab In [place] Sent: 10/30/2024   8:23 AM EST To: Arley KATHEE Hof, MD

## 2024-10-31 NOTE — Progress Notes (Signed)
 LVM on mobile and home # for her to call office

## 2024-10-31 NOTE — Telephone Encounter (Signed)
 Left message for patient to discuss result note re: INR.

## 2024-10-31 NOTE — Telephone Encounter (Signed)
 [name] [name] called back and reports she may have missed only 1 dose of warfarin. Confirms dose is 5 mg daily. Informed her that INR has been running low past few times. MD wants to recheck in 1 month (12/17) and if still low, will make dosing change. She understands and agrees.

## 2024-11-27 ENCOUNTER — Ambulatory Visit: Payer: Self-pay | Admitting: Oncology

## 2024-11-27 ENCOUNTER — Inpatient Hospital Stay: Attending: Oncology

## 2024-11-27 DIAGNOSIS — I82402 Acute embolism and thrombosis of unspecified deep veins of left lower extremity: Secondary | ICD-10-CM | POA: Insufficient documentation

## 2024-11-27 DIAGNOSIS — Z86718 Personal history of other venous thrombosis and embolism: Secondary | ICD-10-CM | POA: Insufficient documentation

## 2024-11-27 LAB — PROTIME-INR
INR: 2 — ABNORMAL HIGH (ref 0.8–1.2)
Prothrombin Time: 23.9 s — ABNORMAL HIGH (ref 11.4–15.2)

## 2024-11-28 NOTE — Telephone Encounter (Signed)
 Patient gave verbal understanding and had no further question

## 2024-11-28 NOTE — Telephone Encounter (Signed)
-----   Message from Arley Hof, MD sent at 11/27/2024  7:33 PM EST ----- Please call patient, continue Coumadin  at the current dose, repeat PT/INR in 1 month, follow-up as scheduled

## 2024-12-20 ENCOUNTER — Telehealth: Payer: Self-pay | Admitting: Oncology

## 2024-12-20 NOTE — Telephone Encounter (Signed)
 PT called to reschedule LAB, needs Monday. Day and Time confirmed.

## 2024-12-23 ENCOUNTER — Inpatient Hospital Stay: Payer: Self-pay | Attending: Oncology

## 2024-12-23 ENCOUNTER — Ambulatory Visit: Payer: Self-pay | Admitting: Oncology

## 2024-12-23 DIAGNOSIS — Z86718 Personal history of other venous thrombosis and embolism: Secondary | ICD-10-CM | POA: Insufficient documentation

## 2024-12-23 DIAGNOSIS — I82402 Acute embolism and thrombosis of unspecified deep veins of left lower extremity: Secondary | ICD-10-CM | POA: Insufficient documentation

## 2024-12-23 LAB — PROTIME-INR
INR: 2.2 — ABNORMAL HIGH (ref 0.8–1.2)
Prothrombin Time: 25.7 s — ABNORMAL HIGH (ref 11.4–15.2)

## 2024-12-24 NOTE — Telephone Encounter (Signed)
-----   Message from Arley Hof, MD sent at 12/23/2024  3:09 PM EST ----- Please call patient, continue coumadin  at same dose, repeat PT INR 4-6 weeks

## 2024-12-24 NOTE — Telephone Encounter (Signed)
 Called and spoke with patient to let her know to continue her current warfarin dose as prescribed per Dr. Cloretta. She confirmed she is aware of repeat lab appointment on 01/22/25. Denied any other questions/concerns at this time

## 2024-12-25 ENCOUNTER — Other Ambulatory Visit

## 2025-01-01 ENCOUNTER — Other Ambulatory Visit: Payer: Self-pay | Admitting: Oncology

## 2025-01-16 ENCOUNTER — Telehealth: Payer: Self-pay | Admitting: Oncology

## 2025-01-16 NOTE — Telephone Encounter (Signed)
 PT called to reschedule appt due to new insurance starting on 2/18. Updated day and time confirmed.

## 2025-01-22 ENCOUNTER — Other Ambulatory Visit

## 2025-01-30 ENCOUNTER — Inpatient Hospital Stay: Payer: Self-pay

## 2025-02-19 ENCOUNTER — Other Ambulatory Visit

## 2025-03-26 ENCOUNTER — Other Ambulatory Visit

## 2025-04-23 ENCOUNTER — Other Ambulatory Visit

## 2025-05-28 ENCOUNTER — Other Ambulatory Visit

## 2025-06-25 ENCOUNTER — Other Ambulatory Visit

## 2025-07-23 ENCOUNTER — Other Ambulatory Visit

## 2025-08-27 ENCOUNTER — Other Ambulatory Visit

## 2025-08-27 ENCOUNTER — Ambulatory Visit: Admitting: Oncology
